# Patient Record
Sex: Female | Born: 1970
Health system: Southern US, Community
[De-identification: ages and names within clinical notes are randomized; demographics above are authoritative.]

## PROBLEM LIST (undated history)

## (undated) DIAGNOSIS — Z803 Family history of malignant neoplasm of breast: Secondary | ICD-10-CM

## (undated) DIAGNOSIS — Z800 Family history of malignant neoplasm of digestive organs: Secondary | ICD-10-CM

## (undated) DIAGNOSIS — Z8049 Family history of malignant neoplasm of other genital organs: Secondary | ICD-10-CM

## (undated) HISTORY — PX: APPENDECTOMY: SHX54

## (undated) HISTORY — DX: Family history of malignant neoplasm of other genital organs: Z80.49

## (undated) HISTORY — PX: UPPER GASTROINTESTINAL ENDOSCOPY: SHX188

## (undated) HISTORY — DX: Family history of malignant neoplasm of breast: Z80.3

## (undated) HISTORY — DX: Family history of malignant neoplasm of digestive organs: Z80.0

---

## 1997-12-17 ENCOUNTER — Ambulatory Visit: Admission: RE | Admit: 1997-12-17 | Discharge: 1997-12-17 | Payer: Self-pay | Admitting: Family Medicine

## 2001-01-25 ENCOUNTER — Other Ambulatory Visit: Admission: RE | Admit: 2001-01-25 | Discharge: 2001-01-25 | Payer: Self-pay | Admitting: Obstetrics and Gynecology

## 2002-12-25 ENCOUNTER — Encounter: Payer: Self-pay | Admitting: Obstetrics and Gynecology

## 2002-12-25 ENCOUNTER — Encounter: Admission: RE | Admit: 2002-12-25 | Discharge: 2002-12-25 | Payer: Self-pay | Admitting: Obstetrics and Gynecology

## 2003-06-06 ENCOUNTER — Other Ambulatory Visit: Admission: RE | Admit: 2003-06-06 | Discharge: 2003-06-06 | Payer: Self-pay | Admitting: Obstetrics and Gynecology

## 2003-06-10 ENCOUNTER — Ambulatory Visit (HOSPITAL_COMMUNITY): Admission: RE | Admit: 2003-06-10 | Discharge: 2003-06-10 | Payer: Self-pay | Admitting: Obstetrics and Gynecology

## 2003-08-01 ENCOUNTER — Other Ambulatory Visit: Admission: RE | Admit: 2003-08-01 | Discharge: 2003-08-01 | Payer: Self-pay | Admitting: Obstetrics and Gynecology

## 2003-11-22 ENCOUNTER — Other Ambulatory Visit: Admission: RE | Admit: 2003-11-22 | Discharge: 2003-11-22 | Payer: Self-pay | Admitting: Obstetrics & Gynecology

## 2004-01-31 ENCOUNTER — Inpatient Hospital Stay (HOSPITAL_COMMUNITY): Admission: RE | Admit: 2004-01-31 | Discharge: 2004-02-03 | Payer: Self-pay | Admitting: Obstetrics & Gynecology

## 2004-11-27 ENCOUNTER — Other Ambulatory Visit: Admission: RE | Admit: 2004-11-27 | Discharge: 2004-11-27 | Payer: Self-pay | Admitting: Obstetrics and Gynecology

## 2005-06-04 ENCOUNTER — Encounter: Admission: RE | Admit: 2005-06-04 | Discharge: 2005-06-04 | Payer: Self-pay | Admitting: Obstetrics and Gynecology

## 2006-06-06 ENCOUNTER — Encounter: Admission: RE | Admit: 2006-06-06 | Discharge: 2006-06-06 | Payer: Self-pay | Admitting: Obstetrics and Gynecology

## 2007-06-26 ENCOUNTER — Encounter: Admission: RE | Admit: 2007-06-26 | Discharge: 2007-06-26 | Payer: Self-pay | Admitting: Obstetrics and Gynecology

## 2008-09-02 ENCOUNTER — Encounter: Admission: RE | Admit: 2008-09-02 | Discharge: 2008-09-02 | Payer: Self-pay | Admitting: Obstetrics and Gynecology

## 2008-12-03 DIAGNOSIS — R5381 Other malaise: Secondary | ICD-10-CM | POA: Insufficient documentation

## 2008-12-03 DIAGNOSIS — R5383 Other fatigue: Secondary | ICD-10-CM

## 2008-12-03 DIAGNOSIS — R42 Dizziness and giddiness: Secondary | ICD-10-CM | POA: Insufficient documentation

## 2008-12-03 DIAGNOSIS — R002 Palpitations: Secondary | ICD-10-CM | POA: Insufficient documentation

## 2010-04-19 HISTORY — PX: LAPAROSCOPIC APPENDECTOMY: SUR753

## 2010-05-10 ENCOUNTER — Encounter: Payer: Self-pay | Admitting: Obstetrics and Gynecology

## 2010-06-15 ENCOUNTER — Other Ambulatory Visit: Payer: Self-pay | Admitting: Family Medicine

## 2010-06-15 ENCOUNTER — Observation Stay (HOSPITAL_COMMUNITY)
Admission: EM | Admit: 2010-06-15 | Discharge: 2010-06-17 | Disposition: A | Discharge status: Home / Self Care | Payer: BC Managed Care – PPO | Source: Ambulatory Visit | Attending: Surgery | Admitting: Surgery

## 2010-06-15 ENCOUNTER — Ambulatory Visit (HOSPITAL_COMMUNITY)
Admission: RE | Admit: 2010-06-15 | Discharge: 2010-06-15 | Disposition: A | Discharge status: Home / Self Care | Payer: BC Managed Care – PPO | Source: Ambulatory Visit | Attending: Family Medicine | Admitting: Family Medicine

## 2010-06-15 DIAGNOSIS — K3533 Acute appendicitis with perforation and localized peritonitis, with abscess: Secondary | ICD-10-CM | POA: Insufficient documentation

## 2010-06-15 DIAGNOSIS — R1011 Right upper quadrant pain: Secondary | ICD-10-CM | POA: Insufficient documentation

## 2010-06-15 DIAGNOSIS — Z01812 Encounter for preprocedural laboratory examination: Secondary | ICD-10-CM | POA: Insufficient documentation

## 2010-06-15 DIAGNOSIS — K3580 Unspecified acute appendicitis: Principal | ICD-10-CM | POA: Insufficient documentation

## 2010-06-15 DIAGNOSIS — K6389 Other specified diseases of intestine: Secondary | ICD-10-CM | POA: Insufficient documentation

## 2010-06-15 MED ORDER — IOHEXOL 300 MG/ML  SOLN
100.0000 mL | Freq: Once | INTRAMUSCULAR | Status: AC | PRN
  Administered 2010-06-15: 100 mL via INTRAVENOUS

## 2010-06-16 ENCOUNTER — Other Ambulatory Visit: Payer: Self-pay | Admitting: General Surgery

## 2010-06-16 LAB — CBC
HCT: 37.7 % (ref 36.0–46.0)
Hemoglobin: 12.6 g/dL (ref 12.0–15.0)
MCHC: 33.4 g/dL (ref 30.0–36.0)
RDW: 13 % (ref 11.5–15.5)
WBC: 7.5 10*3/uL (ref 4.0–10.5)

## 2010-06-16 LAB — DIFFERENTIAL
Basophils Absolute: 0 10*3/uL (ref 0.0–0.1)
Basophils Relative: 0 % (ref 0–1)
Lymphocytes Relative: 23 % (ref 12–46)
Monocytes Absolute: 0.4 10*3/uL (ref 0.1–1.0)
Neutro Abs: 5.2 10*3/uL (ref 1.7–7.7)

## 2010-06-16 LAB — BASIC METABOLIC PANEL
Calcium: 9 mg/dL (ref 8.4–10.5)
Chloride: 107 mEq/L (ref 96–112)
Creatinine, Ser: 0.72 mg/dL (ref 0.4–1.2)
GFR calc Af Amer: >60 mL/min (ref >60)
Sodium: 141 mEq/L (ref 135–145)

## 2010-06-16 LAB — URINE MICROSCOPIC-ADD ON

## 2010-06-16 LAB — URINALYSIS, ROUTINE W REFLEX MICROSCOPIC
Leukocytes, UA: NEGATIVE
Protein, ur: NEGATIVE mg/dL
Specific Gravity, Urine: 1.044 — ABNORMAL HIGH (ref 1.005–1.030)
Urobilinogen, UA: 0.2 mg/dL (ref 0.0–1.0)

## 2010-06-16 LAB — POCT PREGNANCY, URINE: Preg Test, Ur: NEGATIVE

## 2010-06-17 LAB — CBC
MCH: 31.4 pg (ref 26.0–34.0)
MCHC: 32.4 g/dL (ref 30.0–36.0)
Platelets: 113 10*3/uL — ABNORMAL LOW (ref 150–400)
RDW: 13.3 % (ref 11.5–15.5)

## 2010-06-19 NOTE — Op Note (Signed)
NAMEANGELL, PINCOCK               ACCOUNT NO.:  [id number]  MEDICAL RECORD NO.:  [id number]           PATIENT TYPE:  I  LOCATION:  5120                         FACILITY:  [facility]  PHYSICIAN:  Gabrielle Dare. Janee Morn, M.D.DATE OF BIRTH:  [birth year]  DATE OF PROCEDURE:  06/16/2010 DATE OF DISCHARGE:                              OPERATIVE REPORT   PREOPERATIVE DIAGNOSIS:  Acute appendicitis.  POSTOPERATIVE DIAGNOSIS:  Acute appendicitis with microperforation.  PROCEDURE:  Laparoscopic appendectomy.  SURGEON:  Gabrielle Dare. Janee Morn, MD  ANESTHESIA:  General endotracheal.  HISTORY OF PRESENT ILLNESS:  [name] is a 40 year old female, who saw her primary care physician with 4 days of abdominal pain.  She was sent for outpatient CT scan of the abdomen and pelvis, which demonstrated acute appendicitis with phlegmon.  She was sent to the emergency department where I evaluated her.  History and physical examination were consistent.  She is brought emergently to the operating room for laparoscopic appendectomy.  PROCEDURE IN DETAIL:  Informed consent was obtained.  The patient was identified in the preop holding area.  She received intravenous antibiotics.  She was brought to the operating room.  General endotracheal anesthesia was administered by the anesthesia staff.  Her abdomen was prepped and draped in sterile fashion.  We did a time-out procedure.  Infraumbilical region was infiltrated with 0.25% Marcaine, and infraumbilical incision was made.  Subcutaneous tissues were dissected down revealing the anterior fascia.  This was divided sharply along the midline and the peritoneal cavity was entered under direct vision without difficulty.  A 0 Vicryl pursestring suture was placed around the fascial opening, and the Hasson trocar was inserted into the abdomen.  The abdomen was insufflated with carbon dioxide in standard fashion.  Under direct vision, a 12-mm left lower quadrant and a  5-mm right midabdomen port were placed.  Marcaine 0.25% with epinephrine was used at all port sites.  Laparoscopic exploration revealed a very inflamed appendix that was doubled back over on itself and had some adherent ileal fat pad on it.  Fat pad was gently dissected off it. Next, some of the lateral abdominal wall attachments to the appendix were taken down with the harmonic scalpel.  Next, the mesoappendix was gradually taken down with the harmonic scalpel achieving excellent hemostasis.  The appendix was unfolded somewhat and we got back to the base, which was not significantly acutely inflamed, however, it was somewhat wide, so eased reticulating Endo-GIA stapler.  This was used and the base was divided, and the stapler we had available was not cutting so then all of the staple lines were left in place.  The appendix was cut and placed in EndoCatch bag and removed from the abdomen via the left lower quadrant port site.  Please note the appendix had a very tiny area of perforation consistent with microperforation. There was no significant purulence in the abdomen, just a couple of drops adherent to the appendix.  The abdomen was copiously irrigated. Staple line remained intact and away from the terminal ileum.  The area was further irrigated and meticulous hemostasis was assured.  Irrigation fluid returned clear.  We used a total of almost 2 liters.  Staple line was checked one more time and there was no bleeding as well.  The ports were then removed under direct vision.  Pneumoperitoneum was released. Infraumbilical fascia was closed by tying the 0 Vicryl pursestring suture with care not to trap any intra-abdominal contents.  All three wounds were copiously irrigated with saline.  The skin of each was closed with running 4-0 Vicryl subcuticular stitch followed by Dermabond.  Sponge, needle, and instrument counts were all correct.  The patient tolerated the procedure well without  apparent complication and was taken to the recovery room in stable condition.     Gabrielle Dare Janee Morn, M.D.     BET/MEDQ  D:  06/16/2010  T:  06/16/2010  Job:  [id number]  Electronically Signed by Violeta Gelinas M.D. on 06/19/2010 10:14:46 AM

## 2010-06-24 NOTE — H&P (Addendum)
  [name], [name] NO.:  [id number]  MEDICAL RECORD NO.:  [id number]           PATIENT TYPE:  LOCATION:                                 FACILITY:  PHYSICIAN:  Gabrielle Dare. Janee Morn, M.D.DATE OF BIRTH:  [birth year]  DATE OF ADMISSION:  06/16/2010 DATE OF DISCHARGE:                             HISTORY & PHYSICAL   CHIEF COMPLAINT:  Right lower quadrant abdominal pain.  HISTORY OF PRESENT ILLNESS:  [name]  is a 40 year old white female who developed right lower quadrant abdominal pain since last Friday. This persisted for 4 days.  She was sent for an outpatient CT by her primary care physician, apparently which showed appendicitis with phlegmon and no evidence of perforation.  She continues to have pain in her right lower quadrant.  She has had no nausea or vomiting.  PAST MEDICAL HISTORY:  Negative.  PAST SURGICAL HISTORY:  Cesarean section x2.  SOCIAL HISTORY:  Does not smoke, does not drink alcohol.  She works in an office.  REVIEW OF SYSTEMS:  Include GI complaints as above.  In addition, she has had no significant nausea and vomiting.  Remainder of her system reviews are unremarkable.  MEDICATIONS:  Include birth control pill and Restasis.  ALLERGIES:  NO KNOWN DRUG ALLERGIES.  PHYSICAL EXAM:  VITAL SIGNS:  Temperature 98.2, heart rate 78, blood pressure 127/70, respirations 20, saturation is 98% on room air. HEENT: Ears are clear.  Nares are patent.  Mouth has moist oral mucosa. NECK:  Supple with no tenderness or masses. PULMONARY:  Respiratory effort is good.  Lungs are clear to auscultation. CARDIOVASCULAR:  Heart has normal S1-S2.  Pulses palpable in left chest. Distal pulses are 2+ with no peripheral edema. ABDOMEN:  Soft.  There is tenderness in the right lower quadrant with no guarding.  There is no generalized peritoneal signs.  The abdomen is nondistended.  Bowel sounds are hypoactive. LYMPH:  There was no supraclavicular,  cervical, or periumbilical lymphadenopathy. SKIN:  Warm and dry.  LABORATORY STUDIES:  White blood cell count 7.5, hemoglobin 12.6.  Basic metabolic panel is within normal limits.  CT scan of the abdomen and pelvis shows appendicitis with phlegmon as described above.  There is no evidence of perforation.  IMPRESSION:  Acute appendicitis.  PLAN:  IV antibiotics and we will take her to the operating room tonight for emergency appendectomy.  We will attempt laparoscopic approach. Procedure risks and benefits were discussed in detail with the patient. She is agreeable.  Questions were answered.     Gabrielle Dare Janee Morn, M.D.     BET/MEDQ  D:  06/16/2010  T:  06/16/2010  Job:  [id number]  Electronically Signed by Violeta Gelinas M.D. on 06/23/2010 04:37:33 PM Electronically Signed by Violeta Gelinas M.D. on 06/23/2010 05:17:50 PM Electronically Signed by Violeta Gelinas M.D. on 06/23/2010 05:17:50 PM Electronically Signed by Violeta Gelinas M.D. on 06/23/2010 07:43:20 PM

## 2010-07-01 NOTE — Discharge Summary (Signed)
  [name], [name]               ACCOUNT NO.:  [id number]  MEDICAL RECORD NO.:  [id number]           PATIENT TYPE:  I  LOCATION:  5120                         FACILITY:  [facility]  PHYSICIAN:  Angelia Mould. Derrell Lolling, M.D.DATE OF BIRTH:  1970-11-14  DATE OF ADMISSION:  06/15/2010 DATE OF DISCHARGE:  06/17/2010                              DISCHARGE SUMMARY   HISTORY OF PRESENT ILLNESS:  [name] is a 40 year old female, who developed right lower quadrant pain several days prior to admission. She was sent for an outpatient CT by her prior primary care physician which showed appendicitis, phlegmonous formation, but no evidence of perforation.  She continued to have pain and was sent to the emergency department for evaluation.  She was seen in the emergency department by Dr. Violeta Gelinas, who reviewed her CT scan and exam, the patient confirmed that both clinically and diagnostically the patient had evidence of appendicitis.  Decision was made to start the patient on IV antibiotics and admit the patient for inpatient management and surgical intervention.  SUMMARY OF HOSPITAL COURSE:  The patient was admitted on June 15, 2010.  She was started on IV Invanz 1 g IV q.24 h.  She was taken to the operating room on June 16, 2010.  She underwent laparoscopic appendectomy as well as removal of the phlegmonous formation.  She was sent to the floor in a stable condition.  She received an additional 2 doses of IV Invanz.  Her diet was started on clears and subsequently advanced as of today post technically postoperative day 1, although her surgery was early yesterday and she is feeling quite well having no nausea or vomiting.  She is passing flatus and has minimal pain.  At this point, I feel the patient is stable for discharge.  She had followup labs that showed her white blood cell count remained at 6.6. She has been afebrile in the postoperative course.  DISCHARGE DIAGNOSIS:  Acute  appendicitis, status post laparoscopic appendectomy.  PLAN:  The patient will be given prescription for Augmentin 875 mg twice daily for 7 days.  She is given a prescription for Percocet 1-2 tablets q.6 h. p.r.n. pain.  She is given instructions regarding activity and dietary limitations.  She is asked to come back to the clinic in approximately 2-3 weeks' time for further followup.  With regards to work, she is released to work at her discretion as she does not have any strenuous activity throughout her work day.     Brayton El, PA-C   ______________________________ Angelia Mould. Derrell Lolling, M.D.    KB/MEDQ  D:  06/17/2010  T:  06/18/2010  Job:  [id number]  Electronically Signed by Brayton El  on 06/30/2010 11:00:24 AM Electronically Signed by Claud Kelp M.D. on 07/01/2010 04:54:09 AM

## 2010-09-04 NOTE — Discharge Summary (Signed)
[name], [name] NO.:  [id number]   MEDICAL RECORD NO.:  [id number]          PATIENT TYPE:  INP   LOCATION:  9127                          FACILITY:  [facility]   PHYSICIAN:  Carrington Clamp, M.D. DATE OF BIRTH:  1970-12-09   DATE OF ADMISSION:  01/31/2004  DATE OF DISCHARGE:  02/03/2004                                 DISCHARGE SUMMARY   FINAL DIAGNOSES:  1.  Intrauterine pregnancy at [redacted] weeks gestation.  2.  History of previous cesarean section, desires repeat cesarean section.   PROCEDURE:  Repeat low transverse cesarean section.  Surgeon:  Dr. Annamaria Helling.  Assistant:  Dr. Carrington Clamp.  Complications:  None.   This 40 year old G2 P1 presents at [redacted] weeks gestation for a repeat cesarean  section.  The patient had had a previous cesarean section 13 years ago  secondary to a breech presentation and expressed her desires for repeat  cesarean section during this present pregnancy.  The patient's antepartum  course up to this point had been mostly uncomplicated.  She did have a Pap  performed at the beginning of her pregnancy which showed some atypical  glandular cells.  Colposcopy was performed and her repeat Pap smear in  August was normal.  Otherwise, the patient's antepartum course was  uncomplicated.  She had a negative group B strep culture obtained at 35  weeks.  She was taken to the operating room on January 31, 2004 by Dr.  Annamaria Helling where repeat low transverse cesarean section was performed with  the delivery of a 7-pound 8-ounce female infant with Apgars of 8 and 9.  Delivery went without complications.  The patient's postoperative course was  benign without significant fevers.  She was felt ready for discharge on  postoperative day #3.  She was sent home on a regular diet, told to decrease  activities, told to continue her prenatal vitamins and to take an iron  supplement daily, was given Percocet one to two q.4h. as needed for pain,  and was to  follow up in the office in 4 weeks.   LABORATORY DATA ON DISCHARGE:  The patient had a hemoglobin of 9.5; white  blood cell count of 8.1; and platelets of 104,000.      MB/MEDQ  D:  03/04/2004  T:  03/05/2004  Job:  [id number]

## 2010-09-04 NOTE — Op Note (Signed)
[name], [name] NO.:  [id number]   MEDICAL RECORD NO.:  [id number]          PATIENT TYPE:  INP   LOCATION:  9198                          FACILITY:  [facility]   PHYSICIAN:  Gerrit Friends. Aldona Bar, M.D.   DATE OF BIRTH:  [birth year]   DATE OF PROCEDURE:  01/31/2004  DATE OF DISCHARGE:                                 OPERATIVE REPORT   PREOPERATIVE DIAGNOSES:  1.  Previous cesarean section.  2.  Desire for repeat cesarean section.  3.  39 week intrauterine pregnancy.   POSTOPERATIVE DIAGNOSES:  1.  Previous cesarean section.  2.  Desire for repeat cesarean section.  3.  39 week intrauterine pregnancy.  4.  Delivery of 7 pound 8 ounce female infant Apgar's of 8 & 9.   PROCEDURE:  Repeat low transverse cesarean section.   SURGEON:  Gerrit Friends. Aldona Bar, M.D.   ASSISTANT:  Carrington Clamp, M.D.   ANESTHESIA:  Spinal, Dorinda Hill T. Pamalee Leyden, M.D.   HISTORY:  This 40 year old, gravida 2, para 1 is now at [redacted] weeks gestation.  She had a previous cesarean section 13 years ago for a breech presentation  and is desirous of a repeat cesarean section for delivery with this  pregnancy.   DESCRIPTION OF PROCEDURE:  She was taken to the operating room where after  the satisfactory induction of spinal anesthetic by Dr. Pamalee Leyden, she was  prepped and draped in the usual fashion having been placed in the supine  position slightly tilted to the left. After the patient was adequately  draped with a Foley catheter in place and after good anesthetic levels were  documented, procedure was begun.   A Pfannenstiel incision was made and with minimal difficulty dissected down  sharply to and through the fascia in a low transverse fashion with  hemostasis created at each layer. A subfascial space was created inferiorly  and superiorly after the fascia was opened. Muscles separated in the  midline, peritoneum identified and entered appropriately with care taken to  avoid the bowels superiorly and the  bladder inferiorly. At this time, the  vesicouterine peritoneum was identified and incised in a low transverse  fashion, pushed off the lower uterine segment with ease and then a sharp  incision to the uterus in a low transverse fashion was made with the  Metzenbaum scissors and extended laterally with the fingers.  Amniotomy was  produced--clear fluid--and thereafter from a vertex position a viable female  infant which cried spontaneously at once was delivered. There was a nuchal  cord x1.   After the cord was clamped and cut, the infant was passed off to the  awaiting team and subsequently taken to the nursery in good condition.  Subsequent weight found to be 7 pounds 8 ounces and Apgar's were assigned at  8 & 9.   After the cord bloods were collected, the placenta was delivered intact. The  uterus was then exteriorized, rendered free of any remaining products of  conception and good uterine contractility was afforded with slowly given  intravenous Pitocin and manual stimulation. At this time, the uterine  incision was closed using a single layer of #1 Vicryl in a running locking  fashion and this was over sewn with several figure-of-eight #1 Vicryl's.  Good hemostasis noted. The bladder flap was then reapproximated using  interrupted #0 Vicryl. Tubes and ovaries were noted to be normal and at this  time after the abdomen was lavaged of all free blood and clot, the uterus  was replaced into the abdominal cavity.  At this time, all counts were noted  to be correct and no foreign bodies were noted to be remaining in the  abdominal cavity. Closure of the abdomen at this time was begun in layers.  The abdominoperitoneum was closed with #0 Vicryl in a running fashion and  muscles secured with same. _________ fascial hemostasis, the fascia was then  reapproximated using #0 Vicryl with an angle in the midline bilaterally. The  subcutaneous tissue was rendered hemostatic and staples were then  used to  close the skin. A sterile pressure dressing was applied and the patient at  this time was transferred to the recovery room in satisfactory condition  having tolerated the procedure well.  Estimated blood loss 500 mL.  All  counts x2 correct.   In conclusion, at the end of the procedure, both mother and baby were doing  well in their respective recovery areas.   In summary, this patient desired a repeat cesarean section at term and it  was carried out according to her wishes with delivery of a 7 pound 8 ounce  female infant with Apgar's of 8 & 9.  The procedure went well. All counts  correct x2.      RMW/MEDQ  D:  01/31/2004  T:  01/31/2004  Job:  [id number]

## 2010-12-14 ENCOUNTER — Other Ambulatory Visit: Payer: Self-pay | Admitting: Obstetrics and Gynecology

## 2011-04-07 ENCOUNTER — Ambulatory Visit: Payer: BC Managed Care – PPO | Admitting: Internal Medicine

## 2011-05-10 ENCOUNTER — Ambulatory Visit: Payer: BC Managed Care – PPO | Admitting: Family Medicine

## 2011-05-10 DIAGNOSIS — Z0289 Encounter for other administrative examinations: Secondary | ICD-10-CM

## 2011-06-01 ENCOUNTER — Encounter: Payer: Self-pay | Admitting: Cardiovascular Disease

## 2011-06-01 ENCOUNTER — Telehealth: Payer: Self-pay | Admitting: *Deleted

## 2011-06-01 ENCOUNTER — Ambulatory Visit (INDEPENDENT_AMBULATORY_CARE_PROVIDER_SITE_OTHER): Payer: BC Managed Care – PPO | Admitting: Cardiovascular Disease

## 2011-06-01 VITALS — BP 121/70 | HR 77 | Ht 68.0 in | Wt 171.0 lb

## 2011-06-01 DIAGNOSIS — R002 Palpitations: Secondary | ICD-10-CM

## 2011-06-01 NOTE — Telephone Encounter (Signed)
Pt saw Dr. Clifton James today and needs 3 week follow up.  Appt was made for 3 month follow up in May.  I called pt to schedule 3 week follow up.  Left message to call back

## 2011-06-01 NOTE — Assessment & Plan Note (Addendum)
Her episodes of palpitations are most likely premature beats or SVT. Will arrange a 48 hour Holter monitor. Will also arrange an echo to exclude structural heart disease and assess LVEF. We will check a BMET and TSH when she comes in for her echo. She does not want to do it today since she has not eaten lunch yet.

## 2011-06-01 NOTE — Patient Instructions (Addendum)
Your physician recommends that you schedule a follow-up appointment in:3 weeks.   Your physician has recommended that you wear a holter monitor. Holter monitors are medical devices that record the heart's electrical activity. Doctors most often use these monitors to diagnose arrhythmias. Arrhythmias are problems with the speed or rhythm of the heartbeat. The monitor is a small, portable device. You can wear one while you do your normal daily activities. This is usually used to diagnose what is causing palpitations/syncope (passing out).   Your physician has requested that you have an echocardiogram. Echocardiography is a painless test that uses sound waves to create images of your heart. It provides your doctor with information about the size and shape of your heart and how well your heart's chambers and valves are working. This procedure takes approximately one hour. There are no restrictions for this procedure.   Your physician recommends that you return for lab work on day of echo

## 2011-06-01 NOTE — Progress Notes (Signed)
   History of Present Illness:41 yo WF with no significant past medical history who is a self referral today for evaluation of palpitations. She has no prior cardiac history. She has no DM, HTN, HLD. She describes feeling her heart skip a beat and goes fast. These last for a few seconds. No dizziness, near syncope or syncope. No chest pain or SOB. She drinks two caffeinated drinks per day but does not smoke.   Past Medical History  Diagnosis Date  . Acute appendicitis 2012    Past Surgical History  Procedure Date  . Laparoscopic appendectomy 2012  . Cesarean section 1992, 2005    Current Outpatient Prescriptions  Medication Sig Dispense Refill  . JUNEL FE 1.5/30 1.5-30 MG-MCG tablet As directed -birth control      . Multiple Vitamin (MULTIVITAMIN) tablet Take 1 tablet by mouth daily.      . vitamin B-12 (CYANOCOBALAMIN) 1000 MCG tablet Take 1,000 mcg by mouth daily.        No Known Allergies  History   Social History  . Marital Status: Married    Spouse Name: N/A    Number of Children: 2  . Years of Education: N/A   Occupational History  . Physiological scientist    Social History Main Topics  . Smoking status: Never Smoker   . Smokeless tobacco: Not on file  . Alcohol Use: No  . Drug Use: No  . Sexually Active: Not on file   Other Topics Concern  . Not on file   Social History Narrative  . No narrative on file    Family History  Problem Relation Age of Onset  . Cancer Sister     Breast    Review of Systems:  As stated in the HPI and otherwise negative.   BP 121/70  Pulse 77  Ht 5\' 8"  (1.727 m)  Wt 171 lb (77.565 kg)  BMI 26.00 kg/m2  Physical Examination: General: Well developed, well nourished, NAD HEENT: OP clear, mucus membranes moist SKIN: warm, dry. No rashes. Neuro: No focal deficits Musculoskeletal: Muscle strength 5/5 all ext Psychiatric: Mood and affect normal Neck: No JVD, no carotid bruits, no thyromegaly, no lymphadenopathy. Lungs:Clear  bilaterally, no wheezes, rhonci, crackles Cardiovascular: Regular rate and rhythm. No murmurs, gallops or rubs. Abdomen:Soft. Bowel sounds present. Non-tender.  Extremities: No lower extremity edema. Pulses are 2 + in the bilateral DP/PT.  EKG: NSR, rate 89 bpm. Poor R wave progression through precordial leads.

## 2011-06-02 NOTE — Telephone Encounter (Signed)
Spoke with pt and appt made for her to see Dr. Clifton James on July 12, 2011 at 10:30.

## 2011-06-02 NOTE — Telephone Encounter (Signed)
Follow up from previous call:  Returning call from nurse

## 2011-06-09 ENCOUNTER — Ambulatory Visit: Payer: BC Managed Care – PPO | Admitting: Cardiovascular Disease

## 2011-06-18 ENCOUNTER — Encounter (INDEPENDENT_AMBULATORY_CARE_PROVIDER_SITE_OTHER): Payer: BC Managed Care – PPO

## 2011-06-18 ENCOUNTER — Ambulatory Visit (HOSPITAL_COMMUNITY): Payer: BC Managed Care – PPO | Attending: Cardiology

## 2011-06-18 ENCOUNTER — Other Ambulatory Visit: Payer: Self-pay

## 2011-06-18 ENCOUNTER — Other Ambulatory Visit (INDEPENDENT_AMBULATORY_CARE_PROVIDER_SITE_OTHER): Payer: BC Managed Care – PPO

## 2011-06-18 DIAGNOSIS — R002 Palpitations: Secondary | ICD-10-CM

## 2011-06-18 LAB — BASIC METABOLIC PANEL
Calcium: 8.9 mg/dL (ref 8.4–10.5)
GFR: 82.4 mL/min (ref >60.00)
Potassium: 3.3 mEq/L — ABNORMAL LOW (ref 3.5–5.1)
Sodium: 138 mEq/L (ref 135–145)

## 2011-06-18 LAB — TSH: TSH: 2.67 u[IU]/mL (ref 0.35–5.50)

## 2011-07-01 ENCOUNTER — Telehealth: Payer: Self-pay | Admitting: Cardiovascular Disease

## 2011-07-01 NOTE — Telephone Encounter (Signed)
Fu call °Patient returning your call °

## 2011-07-01 NOTE — Telephone Encounter (Signed)
Pt notified of monitor results 

## 2011-07-12 ENCOUNTER — Ambulatory Visit (INDEPENDENT_AMBULATORY_CARE_PROVIDER_SITE_OTHER): Payer: BC Managed Care – PPO | Admitting: Cardiovascular Disease

## 2011-07-12 ENCOUNTER — Encounter: Payer: Self-pay | Admitting: Cardiovascular Disease

## 2011-07-12 VITALS — BP 110/68 | HR 76 | Ht 67.5 in | Wt 171.4 lb

## 2011-07-12 DIAGNOSIS — I491 Atrial premature depolarization: Secondary | ICD-10-CM

## 2011-07-12 NOTE — Progress Notes (Signed)
   History of Present Illness: 41 yo WF with no significant past medical history who is here today for cardiac follow up. I saw her one month ago for evaluation of palpitations. She has no prior cardiac history. She has no DM, HTN, HLD. She described feeling her heart skip a beat and then racing.  These last for a few seconds. No dizziness, near syncope or syncope. No chest pain or SOB. She drinks two caffeinated drinks per day but does not smoke. I ordered an echo and a 48 hour Holter monitor. Her echo showed normal LV size and function with mild MR. Holter with NSR, PACs.   She tells me today that she has been feeling well. She has cut out her morning coffee and her palpitations are less frequent. She does notice these more frequently during stressful times.   Primary Care Physician: None   Past Medical History  Diagnosis Date  . Acute appendicitis 2012    Past Surgical History  Procedure Date  . Laparoscopic appendectomy 2012  . Cesarean section 1992, 2005    Current Outpatient Prescriptions  Medication Sig Dispense Refill  . JUNEL FE 1.5/30 1.5-30 MG-MCG tablet As directed -birth control      . Multiple Vitamin (MULTIVITAMIN) tablet Take 1 tablet by mouth daily.      . vitamin B-12 (CYANOCOBALAMIN) 1000 MCG tablet Take 1,000 mcg by mouth daily.        No Known Allergies  History   Social History  . Marital Status: Married    Spouse Name: N/A    Number of Children: 2  . Years of Education: N/A   Occupational History  . Physiological scientist    Social History Main Topics  . Smoking status: Never Smoker   . Smokeless tobacco: Not on file  . Alcohol Use: No  . Drug Use: No  . Sexually Active: Not on file   Other Topics Concern  . Not on file   Social History Narrative  . No narrative on file    Family History  Problem Relation Age of Onset  . Cancer Sister     Breast    Review of Systems:  As stated in the HPI and otherwise negative.   BP 110/68  Pulse 76   Ht 5' 7.5" (1.715 m)  Wt 171 lb 6.4 oz (77.747 kg)  BMI 26.45 kg/m2  Physical Examination: General: Well developed, well nourished, NAD HEENT: OP clear, mucus membranes moist SKIN: warm, dry. No rashes. Neuro: No focal deficits Musculoskeletal: Muscle strength 5/5 all ext Psychiatric: Mood and affect normal Neck: No JVD, no carotid bruits, no thyromegaly, no lymphadenopathy. Lungs:Clear bilaterally, no wheezes, rhonci, crackles Cardiovascular: Regular rate and rhythm. No murmurs, gallops or rubs. Abdomen:Soft. Bowel sounds present. Non-tender.  Extremities: No lower extremity edema. Pulses are 2 + in the bilateral DP/PT.  Echo: 06/18/11: Left ventricle: The cavity size was normal. Wall thickness was normal. Systolic function was normal. The estimated ejection fraction was in the range of 55% to 60%. Wall motion was normal; there were no regional wall motion abnormalities. Left ventricular diastolic function parameters were normal. - Mitral valve: Mild regurgitation.  48 Hour Holter monitor: March 2013: NSR, PACs. No significant bradycardia, pauses, VT, SVT or atrial fib.

## 2011-07-12 NOTE — Assessment & Plan Note (Signed)
Likely the cause of her palpitations. Avoid stimulants. No changes. Follow up one year.

## 2011-07-12 NOTE — Patient Instructions (Signed)
Your physician wants you to follow-up in: 12 months.  You will receive a reminder letter in the mail two months in advance. If you don't receive a letter, please call our office to schedule the follow-up appointment.  Your physician recommends that you continue on your current medications as directed. Please refer to the Current Medication list given to you today.   

## 2011-08-31 ENCOUNTER — Ambulatory Visit: Payer: BC Managed Care – PPO | Admitting: Cardiovascular Disease

## 2012-02-28 ENCOUNTER — Other Ambulatory Visit: Payer: Self-pay | Admitting: Obstetrics and Gynecology

## 2012-02-28 DIAGNOSIS — R928 Other abnormal and inconclusive findings on diagnostic imaging of breast: Secondary | ICD-10-CM

## 2012-03-02 ENCOUNTER — Ambulatory Visit
Admission: RE | Admit: 2012-03-02 | Discharge: 2012-03-02 | Disposition: A | Discharge status: Home / Self Care | Payer: BC Managed Care – PPO | Source: Ambulatory Visit | Attending: Obstetrics and Gynecology | Admitting: Obstetrics and Gynecology

## 2012-03-02 DIAGNOSIS — R928 Other abnormal and inconclusive findings on diagnostic imaging of breast: Secondary | ICD-10-CM

## 2012-03-03 ENCOUNTER — Other Ambulatory Visit: Payer: BC Managed Care – PPO

## 2014-03-06 ENCOUNTER — Other Ambulatory Visit: Payer: Self-pay | Admitting: Obstetrics and Gynecology

## 2014-03-06 DIAGNOSIS — R928 Other abnormal and inconclusive findings on diagnostic imaging of breast: Secondary | ICD-10-CM

## 2014-03-25 ENCOUNTER — Ambulatory Visit
Admission: RE | Admit: 2014-03-25 | Discharge: 2014-03-25 | Disposition: A | Discharge status: Home / Self Care | Payer: BC Managed Care – PPO | Source: Ambulatory Visit | Attending: Obstetrics and Gynecology | Admitting: Obstetrics and Gynecology

## 2014-03-25 DIAGNOSIS — R928 Other abnormal and inconclusive findings on diagnostic imaging of breast: Secondary | ICD-10-CM

## 2015-02-27 ENCOUNTER — Other Ambulatory Visit: Payer: Self-pay

## 2015-02-27 DIAGNOSIS — Z1231 Encounter for screening mammogram for malignant neoplasm of breast: Secondary | ICD-10-CM

## 2015-04-03 ENCOUNTER — Ambulatory Visit
Admission: RE | Admit: 2015-04-03 | Discharge: 2015-04-03 | Disposition: A | Discharge status: Home / Self Care | Payer: BLUE CROSS/BLUE SHIELD | Source: Ambulatory Visit

## 2015-04-03 DIAGNOSIS — Z1231 Encounter for screening mammogram for malignant neoplasm of breast: Secondary | ICD-10-CM

## 2016-12-08 ENCOUNTER — Other Ambulatory Visit: Payer: Self-pay | Admitting: Obstetrics and Gynecology

## 2016-12-08 DIAGNOSIS — Z1231 Encounter for screening mammogram for malignant neoplasm of breast: Secondary | ICD-10-CM

## 2016-12-15 ENCOUNTER — Ambulatory Visit (INDEPENDENT_AMBULATORY_CARE_PROVIDER_SITE_OTHER): Payer: BLUE CROSS/BLUE SHIELD | Admitting: Adult Health

## 2016-12-15 ENCOUNTER — Encounter: Payer: Self-pay | Admitting: Family Medicine

## 2016-12-15 ENCOUNTER — Encounter: Payer: Self-pay | Admitting: Adult Health

## 2016-12-15 DIAGNOSIS — Z0000 Encounter for general adult medical examination without abnormal findings: Secondary | ICD-10-CM | POA: Diagnosis not present

## 2016-12-15 DIAGNOSIS — I491 Atrial premature depolarization: Secondary | ICD-10-CM | POA: Diagnosis not present

## 2016-12-15 NOTE — Assessment & Plan Note (Signed)
Denies current cardiac sx's 

## 2016-12-15 NOTE — Assessment & Plan Note (Signed)
Continue excellent water intake and heart healthy diet. Please schedule nurse visit for fasting labs and annual CPE. Continue annual f/u with OG/GYN.

## 2016-12-15 NOTE — Assessment & Plan Note (Signed)
Will check TSH and Vit d levels.

## 2016-12-15 NOTE — Patient Instructions (Signed)
Heart-Healthy Eating Plan Many factors influence your heart health, including eating and exercise habits. Heart (coronary) risk increases with abnormal blood fat (lipid) levels. Heart-healthy meal planning includes limiting unhealthy fats, increasing healthy fats, and making other small dietary changes. This includes maintaining a healthy body weight to help keep lipid levels within a normal range. What is my plan? Your health care provider recommends that you:  Get no more than _25_% of the total calories in your daily diet from fat.  Limit your intake of saturated fat to less than __5__% of your total calories each day.  Limit the amount of cholesterol in your diet to less than _300_ mg per day.  What types of fat should I choose?  Choose healthy fats more often. Choose monounsaturated and polyunsaturated fats, such as olive oil and canola oil, flaxseeds, walnuts, almonds, and seeds.  Eat more omega-3 fats. Good choices include salmon, mackerel, sardines, tuna, flaxseed oil, and ground flaxseeds. Aim to eat fish at least two times each week.  Limit saturated fats. Saturated fats are primarily found in animal products, such as meats, butter, and cream. Plant sources of saturated fats include palm oil, palm kernel oil, and coconut oil.  Avoid foods with partially hydrogenated oils in them. These contain trans fats. Examples of foods that contain trans fats are stick margarine, some tub margarines, cookies, crackers, and other baked goods. What general guidelines do I need to follow?  Check food labels carefully to identify foods with trans fats or high amounts of saturated fat.  Fill one half of your plate with vegetables and green salads. Eat 4-5 servings of vegetables per day. A serving of vegetables equals 1 cup of raw leafy vegetables,  cup of raw or cooked cut-up vegetables, or  cup of vegetable juice.  Fill one fourth of your plate with whole grains. Look for the word "whole" as the  first word in the ingredient list.  Fill one fourth of your plate with lean protein foods.  Eat 4-5 servings of fruit per day. A serving of fruit equals one medium whole fruit,  cup of dried fruit,  cup of fresh, frozen, or canned fruit, or  cup of 100% fruit juice.  Eat more foods that contain soluble fiber. Examples of foods that contain this type of fiber are apples, broccoli, carrots, beans, peas, and barley. Aim to get 20-30 g of fiber per day.  Eat more home-cooked food and less restaurant, buffet, and fast food.  Limit or avoid alcohol.  Limit foods that are high in starch and sugar.  Avoid fried foods.  Cook foods by using methods other than frying. Baking, boiling, grilling, and broiling are all great options. Other fat-reducing suggestions include: ? Removing the skin from poultry. ? Removing all visible fats from meats. ? Skimming the fat off of stews, soups, and gravies before serving them. ? Steaming vegetables in water or broth.  Lose weight if you are overweight. Losing just 5-10% of your initial body weight can help your overall health and prevent diseases such as diabetes and heart disease.  Increase your consumption of nuts, legumes, and seeds to 4-5 servings per week. One serving of dried beans or legumes equals  cup after being cooked, one serving of nuts equals 1 ounces, and one serving of seeds equals  ounce or 1 tablespoon.  You may need to monitor your salt (sodium) intake, especially if you have high blood pressure. Talk with your health care provider or dietitian to get  more information about reducing sodium. What foods can I eat? Grains  Breads, including [place], white, pita, wheat, raisin, rye, oatmeal, and [place]. Tortillas that are neither fried nor made with lard or trans fat. Low-fat rolls, including hotdog and hamburger buns and English muffins. Biscuits. Muffins. Waffles. Pancakes. Light popcorn. Whole-grain cereals. Flatbread. Melba toast.  Pretzels. Breadsticks. Rusks. Low-fat snacks and crackers, including oyster, saltine, matzo, graham, animal, and rye. Rice and pasta, including brown rice and those that are made with whole wheat. Vegetables All vegetables. Fruits All fruits, but limit coconut. Meats and Other Protein Sources Lean, well-trimmed beef, veal, pork, and lamb. Chicken and [place] without skin. All fish and shellfish. Wild duck, rabbit, pheasant, and venison. Egg whites or low-cholesterol egg substitutes. Dried beans, peas, lentils, and tofu.Seeds and most nuts. Dairy Low-fat or nonfat cheeses, including ricotta, string, and mozzarella. Skim or 1% milk that is liquid, powdered, or evaporated. Buttermilk that is made with low-fat milk. Nonfat or low-fat yogurt. Beverages Mineral water. Diet carbonated beverages. Sweets and Desserts Sherbets and fruit ices. Honey, jam, marmalade, jelly, and syrups. Meringues and gelatins. Pure sugar candy, such as hard candy, jelly beans, gumdrops, mints, marshmallows, and small amounts of dark chocolate. [employer] Eat all sweets and desserts in moderation. Fats and Oils Nonhydrogenated (trans-free) margarines. Vegetable oils, including soybean, sesame, sunflower, olive, peanut, safflower, corn, canola, and cottonseed. Salad dressings or mayonnaise that are made with a vegetable oil. Limit added fats and oils that you use for cooking, baking, salads, and as spreads. Other Cocoa powder. Coffee and tea. All seasonings and condiments. The items listed above may not be a complete list of recommended foods or beverages. Contact your dietitian for more options. What foods are not recommended? Grains Breads that are made with saturated or trans fats, oils, or whole milk. Croissants. Butter rolls. Cheese breads. Sweet rolls. Donuts. Buttered popcorn. Chow mein noodles. High-fat crackers, such as cheese or butter crackers. Meats and Other Protein Sources Fatty meats, such as hotdogs,  short ribs, sausage, spareribs, bacon, ribeye roast or steak, and mutton. High-fat deli meats, such as salami and bologna. Caviar. Domestic duck and goose. Organ meats, such as kidney, liver, sweetbreads, brains, gizzard, chitterlings, and heart. Dairy Cream, sour cream, cream cheese, and creamed cottage cheese. Whole milk cheeses, including blue (bleu), Monterey Jack, [place], [place], American, [place], Swiss, [place], [place], and [place]. Whole or 2% milk that is liquid, evaporated, or condensed. Whole buttermilk. Cream sauce or high-fat cheese sauce. Yogurt that is made from whole milk. Beverages Regular sodas and drinks with added sugar. Sweets and Desserts Frosting. Pudding. Cookies. Cakes other than angel food cake. Candy that has milk chocolate or white chocolate, hydrogenated fat, butter, coconut, or unknown ingredients. Buttered syrups. Full-fat ice cream or ice cream drinks. Fats and Oils Gravy that has suet, meat fat, or shortening. Cocoa butter, hydrogenated oils, palm oil, coconut oil, palm kernel oil. These can often be found in baked products, candy, fried foods, nondairy creamers, and whipped toppings. Solid fats and shortenings, including bacon fat, salt pork, lard, and butter. Nondairy cream substitutes, such as coffee creamers and sour cream substitutes. Salad dressings that are made of unknown oils, cheese, or sour cream. The items listed above may not be a complete list of foods and beverages to avoid. Contact your dietitian for more information. This information is not intended to replace advice given to you by your health care provider. Make sure you discuss any questions you have with your health care  provider. Document Released: 01/13/2008 Document Revised: 10/24/2015 Document Reviewed: 09/27/2013 Elsevier Interactive Patient Education  2017 [employer] Inc.   Back Exercises The following exercises strengthen the muscles that help to support the back. They also help to keep the  lower back flexible. Doing these exercises can help to prevent back pain or lessen existing pain. If you have back pain or discomfort, try doing these exercises 2-3 times each day or as told by your health care provider. When the pain goes away, do them once each day, but increase the number of times that you repeat the steps for each exercise (do more repetitions). If you do not have back pain or discomfort, do these exercises once each day or as told by your health care provider. Exercises Single Knee to Chest  Repeat these steps 3-5 times for each leg: 1. Lie on your back on a firm bed or the floor with your legs extended. 2. Bring one knee to your chest. Your other leg should stay extended and in contact with the floor. 3. Hold your knee in place by grabbing your knee or thigh. 4. Pull on your knee until you feel a gentle stretch in your lower back. 5. Hold the stretch for 10-30 seconds. 6. Slowly release and straighten your leg.  Pelvic Tilt  Repeat these steps 5-10 times: 1. Lie on your back on a firm bed or the floor with your legs extended. 2. Bend your knees so they are pointing toward the ceiling and your feet are flat on the floor. 3. Tighten your lower abdominal muscles to press your lower back against the floor. This motion will tilt your pelvis so your tailbone points up toward the ceiling instead of pointing to your feet or the floor. 4. With gentle tension and even breathing, hold this position for 5-10 seconds.  Cat-Cow  Repeat these steps until your lower back becomes more flexible: 1. Get into a hands-and-knees position on a firm surface. Keep your hands under your shoulders, and keep your knees under your hips. You may place padding under your knees for comfort. 2. Let your head hang down, and point your tailbone toward the floor so your lower back becomes rounded like the back of a cat. 3. Hold this position for 5 seconds. 4. Slowly lift your head and point your  tailbone up toward the ceiling so your back forms a sagging arch like the back of a cow. 5. Hold this position for 5 seconds.  Press-Ups  Repeat these steps 5-10 times: 1. Lie on your abdomen (face-down) on the floor. 2. Place your palms near your head, about shoulder-width apart. 3. While you keep your back as relaxed as possible and keep your hips on the floor, slowly straighten your arms to raise the top half of your body and lift your shoulders. Do not use your back muscles to raise your upper torso. You may adjust the placement of your hands to make yourself more comfortable. 4. Hold this position for 5 seconds while you keep your back relaxed. 5. Slowly return to lying flat on the floor.  Bridges  Repeat these steps 10 times: 1. Lie on your back on a firm surface. 2. Bend your knees so they are pointing toward the ceiling and your feet are flat on the floor. 3. Tighten your buttocks muscles and lift your buttocks off of the floor until your waist is at almost the same height as your knees. You should feel the muscles working in your  buttocks and the back of your thighs. If you do not feel these muscles, slide your feet 1-2 inches farther away from your buttocks. 4. Hold this position for 3-5 seconds. 5. Slowly lower your hips to the starting position, and allow your buttocks muscles to relax completely.  If this exercise is too easy, try doing it with your arms crossed over your chest. Abdominal Crunches  Repeat these steps 5-10 times: 1. Lie on your back on a firm bed or the floor with your legs extended. 2. Bend your knees so they are pointing toward the ceiling and your feet are flat on the floor. 3. Cross your arms over your chest. 4. Tip your chin slightly toward your chest without bending your neck. 5. Tighten your abdominal muscles and slowly raise your trunk (torso) high enough to lift your shoulder blades a tiny bit off of the floor. Avoid raising your torso higher than  that, because it can put too much stress on your low back and it does not help to strengthen your abdominal muscles. 6. Slowly return to your starting position.  Back Lifts Repeat these steps 5-10 times: 1. Lie on your abdomen (face-down) with your arms at your sides, and rest your forehead on the floor. 2. Tighten the muscles in your legs and your buttocks. 3. Slowly lift your chest off of the floor while you keep your hips pressed to the floor. Keep the back of your head in line with the curve in your back. Your eyes should be looking at the floor. 4. Hold this position for 3-5 seconds. 5. Slowly return to your starting position.  Contact a health care provider if:  Your back pain or discomfort gets much worse when you do an exercise.  Your back pain or discomfort does not lessen within 2 hours after you exercise. If you have any of these problems, stop doing these exercises right away. Do not do them again unless your health care provider says that you can. Get help right away if:  You develop sudden, severe back pain. If this happens, stop doing the exercises right away. Do not do them again unless your health care provider says that you can. This information is not intended to replace advice given to you by your health care provider. Make sure you discuss any questions you have with your health care provider. Document Released: 05/13/2004 Document Revised: 08/13/2015 Document Reviewed: 05/30/2014 Elsevier Interactive Patient Education  2017 [employer].  Continue your excellent hydration and healthy eating. Please try stretching, Epson Salt Baths, and heating pad to help with lower back pain. Please schedule nurse visit for fasting labs. Annual follow-up, sooner if needed. WELCOME TO THE PRACTICE!

## 2016-12-15 NOTE — Progress Notes (Signed)
Subjective:    Patient ID: [name], female    DOB: 08/07/1970, 46 y.o.   MRN: [id number]  HPI:  [name] is here to establish as a new pt.  She is a very healthy, pleasant 46 year old female. PMH: Intermittent lumbar back pain that will occur with prolonged standing/walking.  She denies previous trauma/injury.  She drinks >80 ounces water/day and eats a heart healthy diet.  She is married and had two sons ages 52 and 13.  She works FT as Social research officer, government for [employer].  She has sig family hx of cancer: Sister dx'd with breast ca at age 22 and she passed away at age 68.   Her mother dx'd with colon ca at age 61-in full remission. She denies tobacco/EOTH use.  Patient Care Team    Relationship Specialty Notifications Start End  William Hamburger D, NP PCP - General Family Medicine  12/15/16   Ob/Gyn, Nestor Ramp    12/15/16     Patient Active Problem List   Diagnosis Date Noted  . Healthcare maintenance 12/15/2016  . PAC (premature atrial contraction) 07/12/2011  . DIZZINESS 12/03/2008  . TIREDNESS 12/03/2008  . PALPITATIONS 12/03/2008     Past Medical History:  Diagnosis Date  . Acute appendicitis 2012     Past Surgical History:  Procedure Laterality Date  . APPENDECTOMY    . CESAREAN SECTION  1992, 2005  . LAPAROSCOPIC APPENDECTOMY  2012     Family History  Problem Relation Age of Onset  . Cancer Sister        Breast and brain  . Cancer Mother        colon  . Hypertension Mother   . Hyperlipidemia Father   . Hypertension Father   . Hypothyroidism Father   . Healthy Son   . Alcohol abuse Maternal Uncle   . Cancer Maternal Grandmother        lung  . Hypothyroidism Sister   . Cancer Sister        uterine  . Healthy Son      History  Drug Use No     History  Alcohol Use No     History  Smoking Status  . Never Smoker  Smokeless Tobacco  . Never Used     Outpatient Encounter Prescriptions as of 12/15/2016  Medication Sig  .  ibuprofen (ADVIL,MOTRIN) 200 MG tablet Take 200 mg by mouth as needed.  Marland Kitchen levonorgestrel (MIRENA) 20 MCG/24HR IUD 1 each by Intrauterine route once.  . Multiple Vitamin (MULTIVITAMIN) tablet Take 1 tablet by mouth daily.  Bertram Gala Glycol-Propyl Glycol (SYSTANE OP) Apply 2 drops to eye as needed.  . vitamin B-12 (CYANOCOBALAMIN) 1000 MCG tablet Take 1,000 mcg by mouth daily.  . [DISCONTINUED] JUNEL FE 1.5/30 1.5-30 MG-MCG tablet As directed -birth control   No facility-administered encounter medications on file as of 12/15/2016.     Allergies: Patient has no known allergies.  Body mass index is 26.17 kg/m.  Blood pressure 101/64, pulse 69, height 5' 7.5" (1.715 m), weight 169 lb 9.6 oz (76.9 kg).     Review of Systems  Constitutional: Positive for fatigue. Negative for activity change, appetite change, chills, diaphoresis, fever and unexpected weight change.  HENT: Negative for congestion.   Eyes: Negative for visual disturbance.  Respiratory: Negative for cough, chest tightness, shortness of breath, wheezing and stridor.   Cardiovascular: Negative for chest pain, palpitations and leg swelling.  Gastrointestinal: Negative for abdominal distention, abdominal  pain, anal bleeding, blood in stool, constipation, diarrhea, nausea, rectal pain and vomiting.  Endocrine: Negative for cold intolerance, heat intolerance, polydipsia, polyphagia and polyuria.  Genitourinary: Negative for difficulty urinating and flank pain.  Musculoskeletal: Negative for arthralgias, back pain, gait problem, joint swelling, myalgias, neck pain and neck stiffness.  Skin: Negative for color change, pallor, rash and wound.  Allergic/Immunologic: Negative for immunocompromised state.  Neurological: Negative for dizziness, tremors, weakness and headaches.  Hematological: Does not bruise/bleed easily.  Psychiatric/Behavioral: Negative for decreased concentration, dysphoric mood, hallucinations, self-injury, sleep  disturbance and suicidal ideas. The patient is not nervous/anxious and is not hyperactive.        Objective:   Physical Exam  Constitutional: She is oriented to person, place, and time. She appears well-developed and well-nourished. No distress.  HENT:  Head: Normocephalic and atraumatic.  Right Ear: Hearing, tympanic membrane, external ear and ear canal normal. Tympanic membrane is not erythematous and not bulging. No decreased hearing is noted.  Left Ear: Hearing, tympanic membrane, external ear and ear canal normal. Tympanic membrane is not erythematous and not bulging. No decreased hearing is noted.  Nose: Nose normal. Right sinus exhibits no maxillary sinus tenderness and no frontal sinus tenderness. Left sinus exhibits no maxillary sinus tenderness and no frontal sinus tenderness.  Mouth/Throat: Uvula is midline, oropharynx is clear and moist and mucous membranes are normal.  Eyes: Pupils are equal, round, and reactive to light. Conjunctivae are normal.  Neck: Normal range of motion. Neck supple.  Cardiovascular: Normal rate, regular rhythm, normal heart sounds and intact distal pulses.   No murmur heard. Pulmonary/Chest: Effort normal and breath sounds normal. No respiratory distress. She has no wheezes. She has no rales. She exhibits no tenderness.  Musculoskeletal: Normal range of motion. She exhibits no tenderness.  Lymphadenopathy:    She has no cervical adenopathy.  Neurological: She is alert and oriented to person, place, and time. Coordination normal.  Skin: Skin is warm and dry. No rash noted. She is not diaphoretic. No erythema. No pallor.  Psychiatric: She has a normal mood and affect. Her behavior is normal. Judgment and thought content normal.  Nursing note and vitals reviewed.         Assessment & Plan:   1. Healthcare maintenance   2. PAC (premature atrial contraction)     Healthcare maintenance Continue excellent water intake and heart healthy diet. Please  schedule nurse visit for fasting labs and annual CPE. Continue annual f/u with OG/GYN.  TIREDNESS Will check TSH and Vit d levels.  PAC (premature atrial contraction) Denies current cardiac sx's.    FOLLOW-UP:  Return in about 1 year (around 12/15/2017) for CPE.

## 2016-12-22 ENCOUNTER — Ambulatory Visit
Admission: RE | Admit: 2016-12-22 | Discharge: 2016-12-22 | Disposition: A | Discharge status: Home / Self Care | Payer: BLUE CROSS/BLUE SHIELD | Source: Ambulatory Visit | Attending: Obstetrics and Gynecology | Admitting: Obstetrics and Gynecology

## 2016-12-22 DIAGNOSIS — Z1231 Encounter for screening mammogram for malignant neoplasm of breast: Secondary | ICD-10-CM | POA: Diagnosis not present

## 2016-12-24 ENCOUNTER — Other Ambulatory Visit (INDEPENDENT_AMBULATORY_CARE_PROVIDER_SITE_OTHER): Payer: BLUE CROSS/BLUE SHIELD

## 2016-12-24 DIAGNOSIS — Z0000 Encounter for general adult medical examination without abnormal findings: Secondary | ICD-10-CM | POA: Diagnosis not present

## 2016-12-24 DIAGNOSIS — R5383 Other fatigue: Secondary | ICD-10-CM

## 2016-12-24 DIAGNOSIS — R002 Palpitations: Secondary | ICD-10-CM

## 2016-12-24 DIAGNOSIS — I491 Atrial premature depolarization: Secondary | ICD-10-CM | POA: Diagnosis not present

## 2016-12-24 DIAGNOSIS — R42 Dizziness and giddiness: Secondary | ICD-10-CM | POA: Diagnosis not present

## 2016-12-25 LAB — COMPREHENSIVE METABOLIC PANEL
A/G RATIO: 2 (ref 1.2–2.2)
ALT: 13 IU/L (ref 0–32)
AST: 16 IU/L (ref 0–40)
Albumin: 4.3 g/dL (ref 3.5–5.5)
Alkaline Phosphatase: 83 IU/L (ref 39–117)
BUN/Creatinine Ratio: 19 (ref 9–23)
BUN: 15 mg/dL (ref 6–24)
Bilirubin Total: 0.6 mg/dL (ref 0.0–1.2)
CALCIUM: 9 mg/dL (ref 8.7–10.2)
CO2: 22 mmol/L (ref 20–29)
Chloride: 102 mmol/L (ref 96–106)
Creatinine, Ser: 0.79 mg/dL (ref 0.57–1.00)
GFR, EST AFRICAN AMERICAN: 103 mL/min/{1.73_m2} (ref >59)
GFR, EST NON AFRICAN AMERICAN: 89 mL/min/{1.73_m2} (ref >59)
GLUCOSE: 93 mg/dL (ref 65–99)
Globulin, Total: 2.2 g/dL (ref 1.5–4.5)
POTASSIUM: 4.4 mmol/L (ref 3.5–5.2)
Sodium: 140 mmol/L (ref 134–144)
TOTAL PROTEIN: 6.5 g/dL (ref 6.0–8.5)

## 2016-12-25 LAB — VITAMIN D 25 HYDROXY (VIT D DEFICIENCY, FRACTURES): VIT D 25 HYDROXY: 29.2 ng/mL — AB (ref 30.0–100.0)

## 2016-12-25 LAB — CBC WITH DIFFERENTIAL
BASOS ABS: 0 10*3/uL (ref 0.0–0.2)
BASOS: 1 %
EOS (ABSOLUTE): 0.1 10*3/uL (ref 0.0–0.4)
Eos: 3 %
Hematocrit: 37.4 % (ref 34.0–46.6)
Hemoglobin: 12.7 g/dL (ref 11.1–15.9)
IMMATURE GRANS (ABS): 0 10*3/uL (ref 0.0–0.1)
IMMATURE GRANULOCYTES: 0 %
LYMPHS: 27 %
Lymphocytes Absolute: 1.3 10*3/uL (ref 0.7–3.1)
MCH: 33 pg (ref 26.6–33.0)
MCHC: 34 g/dL (ref 31.5–35.7)
MCV: 97 fL (ref 79–97)
MONOS ABS: 0.3 10*3/uL (ref 0.1–0.9)
Monocytes: 7 %
NEUTROS PCT: 62 %
Neutrophils Absolute: 3 10*3/uL (ref 1.4–7.0)
RBC: 3.85 x10E6/uL (ref 3.77–5.28)
RDW: 13.9 % (ref 12.3–15.4)
WBC: 4.7 10*3/uL (ref 3.4–10.8)

## 2016-12-25 LAB — LIPID PANEL
CHOL/HDL RATIO: 2.5 ratio (ref 0.0–4.4)
Cholesterol, Total: 157 mg/dL (ref 100–199)
HDL: 62 mg/dL (ref >39)
LDL Calculated: 88 mg/dL (ref 0–99)
Triglycerides: 33 mg/dL (ref 0–149)
VLDL CHOLESTEROL CAL: 7 mg/dL (ref 5–40)

## 2016-12-25 LAB — TSH: TSH: 3 u[IU]/mL (ref 0.450–4.500)

## 2016-12-25 LAB — HEMOGLOBIN A1C
Est. average glucose Bld gHb Est-mCnc: 103 mg/dL
Hgb A1c MFr Bld: 5.2 % (ref 4.8–5.6)

## 2016-12-28 ENCOUNTER — Other Ambulatory Visit: Payer: Self-pay | Admitting: Adult Health

## 2016-12-28 DIAGNOSIS — E559 Vitamin D deficiency, unspecified: Secondary | ICD-10-CM

## 2016-12-28 MED ORDER — VITAMIN D (ERGOCALCIFEROL) 1.25 MG (50000 UNIT) PO CAPS: 50000.0000 [IU] | ORAL_CAPSULE | ORAL | 0 refills | Status: DC

## 2016-12-29 ENCOUNTER — Telehealth: Payer: Self-pay | Admitting: Adult Health

## 2016-12-29 NOTE — Telephone Encounter (Signed)
Pt states the information she received from the pharmacy for her Vitamin D RX said that this medication is used to treat thyroid disorders.  Advised pt that we are prescribing this medication for a low vitamin D level and not for a thyroid disorder.  Pt expressed understanding.  Tiajuana Amass. Masaye Gatchalian, CMA

## 2016-12-29 NOTE — Telephone Encounter (Signed)
Patient called and left VM to return call about labs, she can reached today on her mobile [phone])

## 2016-12-29 NOTE — Telephone Encounter (Signed)
Patient has some clinical questions about her Vit D prescription

## 2017-04-28 DIAGNOSIS — Z6826 Body mass index (BMI) 26.0-26.9, adult: Secondary | ICD-10-CM | POA: Diagnosis not present

## 2017-04-28 DIAGNOSIS — Z01419 Encounter for gynecological examination (general) (routine) without abnormal findings: Secondary | ICD-10-CM | POA: Diagnosis not present

## 2017-04-28 DIAGNOSIS — Z124 Encounter for screening for malignant neoplasm of cervix: Secondary | ICD-10-CM | POA: Diagnosis not present

## 2017-04-28 LAB — HM PAP SMEAR: HM PAP: NEGATIVE

## 2017-05-02 ENCOUNTER — Other Ambulatory Visit: Payer: BLUE CROSS/BLUE SHIELD

## 2017-05-02 DIAGNOSIS — E559 Vitamin D deficiency, unspecified: Secondary | ICD-10-CM

## 2017-05-03 LAB — VITAMIN D 25 HYDROXY (VIT D DEFICIENCY, FRACTURES): Vit D, 25-Hydroxy: 36.8 ng/mL (ref 30.0–100.0)

## 2017-08-24 ENCOUNTER — Encounter: Payer: Self-pay | Admitting: Family Medicine

## 2017-08-24 ENCOUNTER — Ambulatory Visit (INDEPENDENT_AMBULATORY_CARE_PROVIDER_SITE_OTHER): Payer: BLUE CROSS/BLUE SHIELD | Admitting: Family Medicine

## 2017-08-24 VITALS — BP 120/82 | HR 62 | Ht 67.5 in | Wt 164.0 lb

## 2017-08-24 DIAGNOSIS — M26609 Unspecified temporomandibular joint disorder, unspecified side: Secondary | ICD-10-CM

## 2017-08-24 NOTE — Progress Notes (Signed)
Pt here for an acute care OV today   Impression and Recommendations:    1. TMJ (temporomandibular joint syndrome)-  R     1. TMJ -Pt declines PT referral at this time but will see her dentist regarding this issue. She prefers to watch youtube videos for exercises she could try to alleviate her symptoms.  -Apply ice to the area, 3-4 times a day for 15-20 minutes a day.  -continue taking ibuprofen OTC for pain relief. Cautioned pt about the prolonged use of ibuprofen.    Education and routine counseling performed. Handouts provided  Gross side effects, risk and benefits, and alternatives of medications and treatment plan in general discussed with patient.  Patient is aware that all medications have potential side effects and we are unable to predict every side effect or drug-drug interaction that may occur.   Patient will call with any questions prior to using medication if they have concerns.  Expresses verbal understanding and consents to current therapy and treatment regimen.  No barriers to understanding were identified.  Red flag symptoms and signs discussed in detail.  Patient expressed understanding regarding what to do in case of emergency\urgent symptoms   Please see AVS handed out to patient at the end of our visit for further patient instructions/ counseling done pertaining to today's office visit.   Return if symptoms worsen or fail to improve, for Follow-up with [facility] your PCP as you previously discussed with her.     Note: This document was prepared occasionally using Dragon voice recognition software and may include unintentional dictation errors in addition to a scribe.   This document serves as a record of services personally performed by [name], DO. It was created on her behalf by Thelma Barge, a trained medical scribe. The creation of this record is based on the scribe's personal observations and the provider's statements to them.   I have reviewed the  above medical documentation for accuracy and completeness and I concur.  [name] 08/24/17 5:39 PM   --------------------------------------------------------------------------------------------------------------------------------------------------------------------------    Subjective:    CC:  Chief Complaint  Patient presents with  . Jaw Pain    right jaw and ear pain    HPI: [name] is a 47 y.o. female who presents to [facility] today for issues as discussed below.  She complains of R sided jaw pain that began 5 weeks ago. She states she yawned and her jaw popped. Since then, she has had R jaw pain that radiates to her R ear, neck, and head. She has taken ibuprofen with mild relief. She has not iced the area.   Nothing makes her pain better. Chewing makes her pain worse. She last saw her dentist 4-5 months, she goes q 6 months.   She started a new job and reports increased stress recently.   She has no h/o kidney disease.   Problem  TMJ (Temporomandibular Joint Syndrome)     Wt Readings from Last 3 Encounters:  08/24/17 164 lb (74.4 kg)  12/15/16 169 lb 9.6 oz (76.9 kg)  07/12/11 171 lb 6.4 oz (77.7 kg)   BP Readings from Last 3 Encounters:  08/24/17 120/82  12/15/16 101/64  07/12/11 110/68   BMI Readings from Last 3 Encounters:  08/24/17 25.31 kg/m  12/15/16 26.17 kg/m  07/12/11 26.45 kg/m     Patient Care Team    Relationship Specialty Notifications Start End  Julaine Fusi, NP PCP -  General Family Medicine  12/15/16   Ob/Gyn, Nestor Ramp    12/15/16      Patient Active Problem List   Diagnosis Date Noted  . TMJ (temporomandibular joint syndrome) 08/24/2017  . Healthcare maintenance 12/15/2016  . PAC (premature atrial contraction) 07/12/2011  . DIZZINESS 12/03/2008  . TIREDNESS 12/03/2008  . PALPITATIONS 12/03/2008    Past Medical history, Surgical history, Family history, Social history, Allergies  and Medications have been entered into the medical record, reviewed and changed as needed.    Current Meds  Medication Sig  . ibuprofen (ADVIL,MOTRIN) 200 MG tablet Take 200 mg by mouth as needed.  Marland Kitchen levonorgestrel (MIRENA) 20 MCG/24HR IUD 1 each by Intrauterine route once.  . Multiple Vitamin (MULTIVITAMIN) tablet Take 1 tablet by mouth daily.  Bertram Gala Glycol-Propyl Glycol (SYSTANE OP) Apply 2 drops to eye as needed.  . vitamin B-12 (CYANOCOBALAMIN) 1000 MCG tablet Take 1,000 mcg by mouth daily.  . Vitamin D, Ergocalciferol, (DRISDOL) 50000 units CAPS capsule Take 1 capsule (50,000 Units total) by mouth every 7 (seven) days.    Allergies:  No Known Allergies   Review of Systems: General:   Denies fever, chills, unexplained weight loss.  Optho/Auditory:   Denies visual changes, blurred vision/LOV Respiratory:   Denies wheeze, DOE more than baseline levels.  Cardiovascular:   Denies chest pain, palpitations, new onset peripheral edema  Gastrointestinal:   Denies nausea, vomiting, diarrhea, abd pain.  Genitourinary: Denies dysuria, freq/ urgency, flank pain or discharge from genitals.  Endocrine:     Denies hot or cold intolerance, polyuria, polydipsia. Musculoskeletal:   Denies unexplained myalgias, joint swelling, unexplained arthralgias, gait problems.  Skin:  Denies new onset rash, suspicious lesions Neurological:     Denies dizziness, unexplained weakness, numbness  Psychiatric/Behavioral:   Denies mood changes, suicidal or homicidal ideations, hallucinations    Objective:   Blood pressure 120/82, pulse 62, height 5' 7.5" (1.715 m), weight 164 lb (74.4 kg), SpO2 100 %. Body mass index is 25.31 kg/m. General:  Well Developed, well nourished, appropriate for stated age.  Neuro:  Alert and oriented,  extra-ocular muscles intact  HEENT:  Normocephalic, atraumatic, neck supple Oral: TMJs bilaterally without clicking or abnormality.  No dislocation noted.  Masseter muscle on  the right more boggy and tighter than left.  Otherwise no oral lesions or masses.  Neck supple without masses. Skin:  no gross rash, warm, pink. Vascular:  Ext warm, no cyanosis apprec.; cap RF less 2 sec. Psych:  No HI/SI, judgement and insight good, Euthymic mood. Full Affect.

## 2017-08-24 NOTE — Patient Instructions (Addendum)
-Also please see if we have a handout on TMJ for the patient in our sports med advisor  - If you change your mind on the referral to physical therapy for this condition please let your PCP know. - Please follow-up with your dentist regarding evaluation for right-sided temporomandibular joint dysfunction.  Ask about mouthguard and see if that would be appropriate  Temporomandibular Joint Syndrome Temporomandibular joint (TMJ) syndrome is a condition that affects the joints between your jaw and your skull. The TMJs are located near your ears and allow your jaw to open and close. These joints and the nearby muscles are involved in all movements of the jaw. People with TMJ syndrome have pain in the area of these joints and muscles. Chewing, biting, or other movements of the jaw can be difficult or painful. TMJ syndrome can be caused by various things. In many cases, the condition is mild and goes away within a few weeks. For some people, the condition can become a long-term problem. What are the causes? Possible causes of TMJ syndrome include:  Grinding your teeth or clenching your jaw. Some people do this when they are under stress.  Arthritis.  Injury to the jaw.  Head or neck injury.  Teeth or dentures that are not aligned well.  In some cases, the cause of TMJ syndrome may not be known. What are the signs or symptoms? The most common symptom is an aching pain on the side of the head in the area of the TMJ. Other symptoms may include:  Pain when moving your jaw, such as when chewing or biting.  Being unable to open your jaw all the way.  Making a clicking sound when you open your mouth.  Headache.  Earache.  Neck or shoulder pain.  How is this diagnosed? Diagnosis can usually be made based on your symptoms, your medical history, and a physical exam. Your health care provider may check the range of motion of your jaw. Imaging tests, such as X-rays or an MRI, are sometimes done.  You may need to see your dentist to determine if your teeth and jaw are lined up correctly. How is this treated? TMJ syndrome often goes away on its own. If treatment is needed, the options may include:  Eating soft foods and applying ice or heat.  Medicines to relieve pain or inflammation.  Medicines to relax the muscles.  A splint, bite plate, or mouthpiece to prevent teeth grinding or jaw clenching.  Relaxation techniques or counseling to help reduce stress.  Transcutaneous electrical nerve stimulation (TENS). This helps to relieve pain by applying an electrical current through the skin.  Acupuncture. This is sometimes helpful to relieve pain.  Jaw surgery. This is rarely needed.  Follow these instructions at home:  Take medicines only as directed by your health care provider.  Eat a soft diet if you are having trouble chewing.  Apply ice to the painful area. ? Put ice in a plastic bag. ? Place a towel between your skin and the bag. ? Leave the ice on for 20 minutes, 2-3 times a day.  Apply a warm compress to the painful area as directed.  Massage your jaw area and perform any jaw stretching exercises as recommended by your health care provider.  If you were given a mouthpiece or bite plate, wear it as directed.  Avoid foods that require a lot of chewing. Do not chew gum.  Keep all follow-up visits as directed by your health care provider.  This is important. Contact a health care provider if:  You are having trouble eating.  You have new or worsening symptoms. Get help right away if:  Your jaw locks open or closed. This information is not intended to replace advice given to you by your health care provider. Make sure you discuss any questions you have with your health care provider. Document Released: 12/29/2000 Document Revised: 12/04/2015 Document Reviewed: 11/08/2013 Elsevier Interactive Patient Education  [employer].

## 2017-12-14 NOTE — Progress Notes (Deleted)
Subjective:    Patient ID: [name], female    DOB: [birth year], 47 y.o.   MRN: [id number]  HPI:  12/15/17 OV: [name] is here to establish as a new pt.  She is a very healthy, pleasant 47 year old female. PMH: Intermittent lumbar back pain that will occur with prolonged standing/walking.  She denies previous trauma/injury.  She drinks >80 ounces water/day and eats a heart healthy diet.  She is married and had two sons ages 48 and 36.  She works FT as Social research officer, government for [employer].  She has sig family hx of cancer: Sister dx'd with breast ca at age 37 and she passed away at age 26.   Her mother dx'd with colon ca at age 86-in full remission. She denies tobacco/EOTH use.  12/14/17 OV: [name] presents for CPE   Of Note- she was seen May 2019 for TMJ- given exercises to perform, declined PT referral  Healthcare Maintenance: PAP- Mammogram- Colonoscopy- Immunizations-   Patient Care Team    Relationship Specialty Notifications Start End  Julaine Fusi, NP PCP - General Family Medicine  12/15/16   Ob/Gyn, Nestor Ramp    12/15/16     Patient Active Problem List   Diagnosis Date Noted  . TMJ (temporomandibular joint syndrome) 08/24/2017  . Healthcare maintenance 12/15/2016  . PAC (premature atrial contraction) 07/12/2011  . DIZZINESS 12/03/2008  . TIREDNESS 12/03/2008  . PALPITATIONS 12/03/2008     Past Medical History:  Diagnosis Date  . Acute appendicitis 2012     Past Surgical History:  Procedure Laterality Date  . APPENDECTOMY    . CESAREAN SECTION  1992, 2005  . LAPAROSCOPIC APPENDECTOMY  2012     Family History  Problem Relation Age of Onset  . Cancer Sister        Breast and brain  . Breast cancer Sister 61  . Cancer Mother        colon  . Hypertension Mother   . Hyperlipidemia Father   . Hypertension Father   . Hypothyroidism Father   . Healthy Son   . Alcohol abuse Maternal Uncle   . Cancer Maternal Grandmother        lung   . Hypothyroidism Sister   . Cancer Sister        uterine  . Healthy Son      Social History   Substance and Sexual Activity  Drug Use No     Social History   Substance and Sexual Activity  Alcohol Use No     Social History   Tobacco Use  Smoking Status Never Smoker  Smokeless Tobacco Never Used     Outpatient Encounter Medications as of 12/15/2017  Medication Sig  . ibuprofen (ADVIL,MOTRIN) 200 MG tablet Take 200 mg by mouth as needed.  Marland Kitchen levonorgestrel (MIRENA) 20 MCG/24HR IUD 1 each by Intrauterine route once.  . Multiple Vitamin (MULTIVITAMIN) tablet Take 1 tablet by mouth daily.  Bertram Gala Glycol-Propyl Glycol (SYSTANE OP) Apply 2 drops to eye as needed.  . vitamin B-12 (CYANOCOBALAMIN) 1000 MCG tablet Take 1,000 mcg by mouth daily.  . Vitamin D, Ergocalciferol, (DRISDOL) 50000 units CAPS capsule Take 1 capsule (50,000 Units total) by mouth every 7 (seven) days.   No facility-administered encounter medications on file as of 12/15/2017.     Allergies: Patient has no known allergies.  There is no height or weight on file to calculate BMI.  There were no vitals taken for this  visit.     Review of Systems  Constitutional: Positive for fatigue. Negative for activity change, appetite change, chills, diaphoresis, fever and unexpected weight change.  HENT: Negative for congestion.   Eyes: Negative for visual disturbance.  Respiratory: Negative for cough, chest tightness, shortness of breath, wheezing and stridor.   Cardiovascular: Negative for chest pain, palpitations and leg swelling.  Gastrointestinal: Negative for abdominal distention, abdominal pain, anal bleeding, blood in stool, constipation, diarrhea, nausea, rectal pain and vomiting.  Endocrine: Negative for cold intolerance, heat intolerance, polydipsia, polyphagia and polyuria.  Genitourinary: Negative for difficulty urinating and flank pain.  Musculoskeletal: Negative for arthralgias, back pain,  gait problem, joint swelling, myalgias, neck pain and neck stiffness.  Skin: Negative for color change, pallor, rash and wound.  Allergic/Immunologic: Negative for immunocompromised state.  Neurological: Negative for dizziness, tremors, weakness and headaches.  Hematological: Does not bruise/bleed easily.  Psychiatric/Behavioral: Negative for decreased concentration, dysphoric mood, hallucinations, self-injury, sleep disturbance and suicidal ideas. The patient is not nervous/anxious and is not hyperactive.        Objective:   Physical Exam  Constitutional: She is oriented to person, place, and time. She appears well-developed and well-nourished. No distress.  HENT:  Head: Normocephalic and atraumatic.  Right Ear: Hearing, tympanic membrane, external ear and ear canal normal. Tympanic membrane is not erythematous and not bulging. No decreased hearing is noted.  Left Ear: Hearing, tympanic membrane, external ear and ear canal normal. Tympanic membrane is not erythematous and not bulging. No decreased hearing is noted.  Nose: Nose normal. Right sinus exhibits no maxillary sinus tenderness and no frontal sinus tenderness. Left sinus exhibits no maxillary sinus tenderness and no frontal sinus tenderness.  Mouth/Throat: Uvula is midline, oropharynx is clear and moist and mucous membranes are normal.  Eyes: Pupils are equal, round, and reactive to light. Conjunctivae are normal.  Neck: Normal range of motion. Neck supple.  Cardiovascular: Normal rate, regular rhythm, normal heart sounds and intact distal pulses.  No murmur heard. Pulmonary/Chest: Effort normal and breath sounds normal. No respiratory distress. She has no wheezes. She has no rales. She exhibits no tenderness.  Musculoskeletal: Normal range of motion. She exhibits no tenderness.  Lymphadenopathy:    She has no cervical adenopathy.  Neurological: She is alert and oriented to person, place, and time. Coordination normal.  Skin: Skin  is warm and dry. No rash noted. She is not diaphoretic. No erythema. No pallor.  Psychiatric: She has a normal mood and affect. Her behavior is normal. Judgment and thought content normal.  Nursing note and vitals reviewed.         Assessment & Plan:   No diagnosis found.  No problem-specific Assessment & Plan notes found for this encounter.    FOLLOW-UP:  No follow-ups on file.

## 2017-12-15 ENCOUNTER — Encounter: Payer: BLUE CROSS/BLUE SHIELD | Admitting: Adult Health

## 2018-01-12 NOTE — Progress Notes (Signed)
Subjective:    Patient ID: [name], female    DOB: 1971/04/18, 47 y.o.   MRN: [id number]  HPI: 12/15/16 OV:  [name] is here to establish as a new pt.  She is a very healthy, pleasant 47 year old female. PMH: Intermittent lumbar back pain that will occur with prolonged standing/walking.  She denies previous trauma/injury.  She drinks >80 ounces water/day and eats a heart healthy diet.  She is married and had two sons ages 58 and 45.  She works FT as Social research officer, government for [employer].  She has sig family hx of cancer: Sister dx'd with breast ca at age 38 and she passed away at age 15.   Her mother dx'd with colon ca at age 75-in full remission. She denies tobacco/EOTH use.  01/16/18 OV: [name] presents for CPE She continues to drink >80 ounces water/day and follows heart healthy diet She enjoys walking several times/week She continues to abstain and from tobacco/ETOH  She has one acute complaint- increased "moving legs at night that wakes my several times a night". She reports RLS sx's progressively worsening >3 years and estimates sx's to occur 2-3 nights per week and cause her to typically wake 0100, 0300, 0430 which will result in sig fatigue the following day. She has never been on Gabapentin for sx management Fasting labs obtained today  Healthcare Maintenance: PAP-UTD, Jan 2019, scanned in chart Mammogram-ordered  Immunizations- declined Tdap today  Patient Care Team    Relationship Specialty Notifications Start End  William Hamburger D, NP PCP - General Family Medicine  12/15/16   Ob/Gyn, Nestor Ramp    12/15/16     Patient Active Problem List   Diagnosis Date Noted  . Restless legs syndrome (RLS) 01/16/2018  . TMJ (temporomandibular joint syndrome) 08/24/2017  . Healthcare maintenance 12/15/2016  . PAC (premature atrial contraction) 07/12/2011  . DIZZINESS 12/03/2008  . TIREDNESS 12/03/2008  . PALPITATIONS 12/03/2008     Past Medical History:    Diagnosis Date  . Acute appendicitis 2012     Past Surgical History:  Procedure Laterality Date  . APPENDECTOMY    . CESAREAN SECTION  1992, 2005  . LAPAROSCOPIC APPENDECTOMY  2012     Family History  Problem Relation Age of Onset  . Cancer Sister        Breast and brain  . Breast cancer Sister 34  . Cancer Mother        colon  . Hypertension Mother   . Hyperlipidemia Father   . Hypertension Father   . Hypothyroidism Father   . Healthy Son   . Alcohol abuse Maternal Uncle   . Cancer Maternal Grandmother        lung  . Hypothyroidism Sister   . Cancer Sister        uterine  . Healthy Son      Social History   Substance and Sexual Activity  Drug Use No     Social History   Substance and Sexual Activity  Alcohol Use No     Social History   Tobacco Use  Smoking Status Never Smoker  Smokeless Tobacco Never Used     Outpatient Encounter Medications as of 01/16/2018  Medication Sig  . Cholecalciferol (VITAMIN D3) 2000 units TABS Take 1 capsule by mouth daily.  Marland Kitchen ibuprofen (ADVIL,MOTRIN) 200 MG tablet Take 200 mg by mouth as needed.  Marland Kitchen levonorgestrel (MIRENA) 20 MCG/24HR IUD 1 each by Intrauterine route once.  Marland Kitchen  Multiple Vitamin (MULTIVITAMIN) tablet Take 1 tablet by mouth daily.  . Multiple Vitamins-Minerals (ADULT ONE DAILY GUMMIES PO) Take 1 each by mouth daily.  Bertram Gala Glycol-Propyl Glycol (SYSTANE OP) Apply 2 drops to eye as needed.  . vitamin B-12 (CYANOCOBALAMIN) 1000 MCG tablet Take 1,000 mcg by mouth daily.  Marland Kitchen gabapentin (NEURONTIN) 100 MG capsule 1-2 capsules at bedtime as needed for restless leg syndrome  . [DISCONTINUED] Vitamin D, Ergocalciferol, (DRISDOL) 50000 units CAPS capsule Take 1 capsule (50,000 Units total) by mouth every 7 (seven) days.   No facility-administered encounter medications on file as of 01/16/2018.     Allergies: Patient has no known allergies.  Body mass index is 25.48 kg/m.  Blood pressure 120/77, pulse  67, height 5' 7.5" (1.715 m), weight 165 lb 1.6 oz (74.9 kg), SpO2 100 %.    Review of Systems  Constitutional: Positive for fatigue. Negative for activity change, appetite change, chills, diaphoresis, fever and unexpected weight change.  HENT: Negative for congestion.   Eyes: Negative for visual disturbance.  Respiratory: Negative for cough, chest tightness, shortness of breath, wheezing and stridor.   Cardiovascular: Negative for chest pain, palpitations and leg swelling.  Gastrointestinal: Negative for abdominal distention, abdominal pain, anal bleeding, blood in stool, constipation, diarrhea, nausea, rectal pain and vomiting.  Endocrine: Negative for cold intolerance, heat intolerance, polydipsia, polyphagia and polyuria.  Genitourinary: Negative for difficulty urinating and flank pain.  Musculoskeletal: Negative for arthralgias, back pain, gait problem, joint swelling, myalgias, neck pain and neck stiffness.  Skin: Negative for color change, pallor, rash and wound.  Allergic/Immunologic: Negative for immunocompromised state.  Neurological: Negative for dizziness, tremors, weakness and headaches.  Hematological: Does not bruise/bleed easily.  Psychiatric/Behavioral: Negative for decreased concentration, dysphoric mood, hallucinations, self-injury, sleep disturbance and suicidal ideas. The patient is not nervous/anxious and is not hyperactive.        Objective:   Physical Exam  Constitutional: She is oriented to person, place, and time. She appears well-developed and well-nourished. No distress.  HENT:  Head: Normocephalic and atraumatic.  Right Ear: Hearing, tympanic membrane, external ear and ear canal normal. Tympanic membrane is not erythematous and not bulging. No decreased hearing is noted.  Left Ear: Hearing, tympanic membrane, external ear and ear canal normal. Tympanic membrane is not erythematous and not bulging. No decreased hearing is noted.  Nose: Nose normal. No mucosal  edema. Right sinus exhibits no maxillary sinus tenderness and no frontal sinus tenderness. Left sinus exhibits no maxillary sinus tenderness and no frontal sinus tenderness.  Mouth/Throat: Uvula is midline, oropharynx is clear and moist and mucous membranes are normal. No oropharyngeal exudate, posterior oropharyngeal edema, posterior oropharyngeal erythema or tonsillar abscesses.  Eyes: Pupils are equal, round, and reactive to light. Conjunctivae are normal.  Neck: Normal range of motion. Neck supple. No thyromegaly present.  Cardiovascular: Normal rate, regular rhythm, normal heart sounds and intact distal pulses.  No murmur heard. Pulmonary/Chest: Effort normal and breath sounds normal. No respiratory distress. She has no decreased breath sounds. She has no wheezes. She has no rhonchi. She has no rales. She exhibits no tenderness. Right breast exhibits no inverted nipple, no mass, no nipple discharge, no skin change and no tenderness. Left breast exhibits no inverted nipple, no mass, no nipple discharge, no skin change and no tenderness. Breasts are symmetrical.    Palpable, non-tender R axillary lymph nodes- been present for years per pt  Abdominal: Soft. Bowel sounds are normal. She exhibits no distension and no mass.  There is no tenderness. There is no rebound and no guarding.  Musculoskeletal: Normal range of motion. She exhibits no tenderness.  Lymphadenopathy:    She has no cervical adenopathy.  Neurological: She is alert and oriented to person, place, and time. Coordination normal.  Skin: Skin is warm and dry. No rash noted. She is not diaphoretic. No erythema. No pallor.  Psychiatric: She has a normal mood and affect. Her behavior is normal. Judgment and thought content normal.  Nursing note and vitals reviewed.     Assessment & Plan:   1. Screening for breast cancer   2. Healthcare maintenance   3. Restless legs syndrome (RLS)     Healthcare maintenance Continue to drink  plenty of water and follow Mediterranean diet. Increase regular exercise, ie walking, biking, yoga, stretching. We will call you when lab results are available, Recommend annual follow-up, sooner if needed.  Restless legs syndrome (RLS) Worsening sx's >3 years Sx's to occur 2-3 nights per week and cause her to typically wake 0100, 0300, 0430 which will result in sig fatigue the following day. Started on Gabapentin 100mg  capsule: 1-2 cap QHS PRN  FOLLOW-UP:  Return in about 1 year (around 01/17/2019) for CPE, Fasting Labs.

## 2018-01-16 ENCOUNTER — Encounter: Payer: Self-pay | Admitting: Adult Health

## 2018-01-16 ENCOUNTER — Ambulatory Visit (INDEPENDENT_AMBULATORY_CARE_PROVIDER_SITE_OTHER): Payer: BLUE CROSS/BLUE SHIELD | Admitting: Adult Health

## 2018-01-16 VITALS — BP 120/77 | HR 67 | Ht 67.5 in | Wt 165.1 lb

## 2018-01-16 DIAGNOSIS — Z1239 Encounter for other screening for malignant neoplasm of breast: Secondary | ICD-10-CM

## 2018-01-16 DIAGNOSIS — G2581 Restless legs syndrome: Secondary | ICD-10-CM | POA: Diagnosis not present

## 2018-01-16 DIAGNOSIS — Z0000 Encounter for general adult medical examination without abnormal findings: Secondary | ICD-10-CM | POA: Diagnosis not present

## 2018-01-16 DIAGNOSIS — Z1231 Encounter for screening mammogram for malignant neoplasm of breast: Secondary | ICD-10-CM | POA: Diagnosis not present

## 2018-01-16 MED ORDER — GABAPENTIN 100 MG PO CAPS: ORAL_CAPSULE | ORAL | 0 refills | Status: DC

## 2018-01-16 NOTE — Assessment & Plan Note (Signed)
Worsening sx's >3 years Sx's to occur 2-3 nights per week and cause her to typically wake 0100, 0300, 0430 which will result in sig fatigue the following day. Started on Gabapentin 100mg  capsule: 1-2 cap QHS PRN

## 2018-01-16 NOTE — Patient Instructions (Signed)
Preventive Care for Adults, Female  A healthy lifestyle and preventive care can promote health and wellness. Preventive health guidelines for women include the following key practices.   A routine yearly physical is a good way to check with your health care provider about your health and preventive screening. It is a chance to share any concerns and updates on your health and to receive a thorough exam.   Visit your dentist for a routine exam and preventive care every 6 months. Brush your teeth twice a day and floss once a day. Good oral hygiene prevents tooth decay and gum disease.   The frequency of eye exams is based on your age, health, family medical history, use of contact lenses, and other factors. Follow your health care provider's recommendations for frequency of eye exams.   Eat a healthy diet. Foods like vegetables, fruits, whole grains, low-fat dairy products, and lean protein foods contain the nutrients you need without too many calories. Decrease your intake of foods high in solid fats, added sugars, and salt. Eat the right amount of calories for you.Get information about a proper diet from your health care provider, if necessary.   Regular physical exercise is one of the most important things you can do for your health. Most adults should get at least 150 minutes of moderate-intensity exercise (any activity that increases your heart rate and causes you to sweat) each week. In addition, most adults need muscle-strengthening exercises on 2 or more days a week.   Maintain a healthy weight. The body mass index (BMI) is a screening tool to identify possible weight problems. It provides an estimate of body fat based on height and weight. Your health care provider can find your BMI, and can help you achieve or maintain a healthy weight.For adults 20 years and older:   - A BMI below 18.5 is considered underweight.   - A BMI of 18.5 to 24.9 is normal.   - A BMI of 25 to 29.9 is  considered overweight.   - A BMI of 30 and above is considered obese.   Maintain normal blood lipids and cholesterol levels by exercising and minimizing your intake of trans and saturated fats.  Eat a balanced diet with plenty of fruit and vegetables. Blood tests for lipids and cholesterol should begin at age 20 and be repeated every 5 years minimum.  If your lipid or cholesterol levels are high, you are over 40, or you are at high risk for heart disease, you may need your cholesterol levels checked more frequently.Ongoing high lipid and cholesterol levels should be treated with medicines if diet and exercise are not working.   If you smoke, find out from your health care provider how to quit. If you do not use tobacco, do not start.   Lung cancer screening is recommended for adults aged 55-80 years who are at high risk for developing lung cancer because of a history of smoking. A yearly low-dose CT scan of the lungs is recommended for people who have at least a 30-pack-year history of smoking and are a current smoker or have quit within the past 15 years. A pack year of smoking is smoking an average of 1 pack of cigarettes a day for 1 year (for example: 1 pack a day for 30 years or 2 packs a day for 15 years). Yearly screening should continue until the smoker has stopped smoking for at least 15 years. Yearly screening should be stopped for people who develop a   health problem that would prevent them from having lung cancer treatment.   If you are pregnant, do not drink alcohol. If you are breastfeeding, be very cautious about drinking alcohol. If you are not pregnant and choose to drink alcohol, do not have more than 1 drink per day. One drink is considered to be 12 ounces (355 mL) of beer, 5 ounces (148 mL) of wine, or 1.5 ounces (44 mL) of liquor.   Avoid use of street drugs. Do not share needles with anyone. Ask for help if you need support or instructions about stopping the use of  drugs.   High blood pressure causes heart disease and increases the risk of stroke. Your blood pressure should be checked at least yearly.  Ongoing high blood pressure should be treated with medicines if weight loss and exercise do not work.   If you are 69-55 years old, ask your health care provider if you should take aspirin to prevent strokes.   Diabetes screening involves taking a blood sample to check your fasting blood sugar level. This should be done once every 3 years, after age 38, if you are within normal weight and without risk factors for diabetes. Testing should be considered at a younger age or be carried out more frequently if you are overweight and have at least 1 risk factor for diabetes.   Breast cancer screening is essential preventive care for women. You should practice "breast self-awareness."  This means understanding the normal appearance and feel of your breasts and may include breast self-examination.  Any changes detected, no matter how small, should be reported to a health care provider.  Women in their 80s and 30s should have a clinical breast exam (CBE) by a health care provider as part of a regular health exam every 1 to 3 years.  After age 66, women should have a CBE every year.  Starting at age 1, women should consider having a mammogram (breast X-ray test) every year.  Women who have a family history of breast cancer should talk to their health care provider about genetic screening.  Women at a high risk of breast cancer should talk to their health care providers about having an MRI and a mammogram every year.   -Breast cancer gene (BRCA)-related cancer risk assessment is recommended for women who have family members with BRCA-related cancers. BRCA-related cancers include breast, ovarian, tubal, and peritoneal cancers. Having family members with these cancers may be associated with an increased risk for harmful changes (mutations) in the breast cancer genes BRCA1 and  BRCA2. Results of the assessment will determine the need for genetic counseling and BRCA1 and BRCA2 testing.   The Pap test is a screening test for cervical cancer. A Pap test can show cell changes on the cervix that might become cervical cancer if left untreated. A Pap test is a procedure in which cells are obtained and examined from the lower end of the uterus (cervix).   - Women should have a Pap test starting at age 57.   - Between ages 90 and 70, Pap tests should be repeated every 2 years.   - Beginning at age 63, you should have a Pap test every 3 years as long as the past 3 Pap tests have been normal.   - Some women have medical problems that increase the chance of getting cervical cancer. Talk to your health care provider about these problems. It is especially important to talk to your health care provider if a  new problem develops soon after your last Pap test. In these cases, your health care provider may recommend more frequent screening and Pap tests.   - The above recommendations are the same for women who have or have not gotten the vaccine for human papillomavirus (HPV).   - If you had a hysterectomy for a problem that was not cancer or a condition that could lead to cancer, then you no longer need Pap tests. Even if you no longer need a Pap test, a regular exam is a good idea to make sure no other problems are starting.   - If you are between ages 36 and 66 years, and you have had normal Pap tests going back 10 years, you no longer need Pap tests. Even if you no longer need a Pap test, a regular exam is a good idea to make sure no other problems are starting.   - If you have had past treatment for cervical cancer or a condition that could lead to cancer, you need Pap tests and screening for cancer for at least 20 years after your treatment.   - If Pap tests have been discontinued, risk factors (such as a new sexual partner) need to be reassessed to determine if screening should  be resumed.   - The HPV test is an additional test that may be used for cervical cancer screening. The HPV test looks for the virus that can cause the cell changes on the cervix. The cells collected during the Pap test can be tested for HPV. The HPV test could be used to screen women aged 70 years and older, and should be used in women of any age who have unclear Pap test results. After the age of 67, women should have HPV testing at the same frequency as a Pap test.   Colorectal cancer can be detected and often prevented. Most routine colorectal cancer screening begins at the age of 57 years and continues through age 26 years. However, your health care provider may recommend screening at an earlier age if you have risk factors for colon cancer. On a yearly basis, your health care provider may provide home test kits to check for hidden blood in the stool.  Use of a small camera at the end of a tube, to directly examine the colon (sigmoidoscopy or colonoscopy), can detect the earliest forms of colorectal cancer. Talk to your health care provider about this at age 23, when routine screening begins. Direct exam of the colon should be repeated every 5 -10 years through age 49 years, unless early forms of pre-cancerous polyps or small growths are found.   People who are at an increased risk for hepatitis B should be screened for this virus. You are considered at high risk for hepatitis B if:  -You were born in a country where hepatitis B occurs often. Talk with your health care provider about which countries are considered high risk.  - Your parents were born in a high-risk country and you have not received a shot to protect against hepatitis B (hepatitis B vaccine).  - You have HIV or AIDS.  - You use needles to inject street drugs.  - You live with, or have sex with, someone who has Hepatitis B.  - You get hemodialysis treatment.  - You take certain medicines for conditions like cancer, organ  transplantation, and autoimmune conditions.   Hepatitis C blood testing is recommended for all people born from [birth year] through 1965 and any individual  with known risks for hepatitis C.   Practice safe sex. Use condoms and avoid high-risk sexual practices to reduce the spread of sexually transmitted infections (STIs). STIs include gonorrhea, chlamydia, syphilis, trichomonas, herpes, HPV, and human immunodeficiency virus (HIV). Herpes, HIV, and HPV are viral illnesses that have no cure. They can result in disability, cancer, and death. Sexually active women aged 25 years and younger should be checked for chlamydia. Older women with new or multiple partners should also be tested for chlamydia. Testing for other STIs is recommended if you are sexually active and at increased risk.   Osteoporosis is a disease in which the bones lose minerals and strength with aging. This can result in serious bone fractures or breaks. The risk of osteoporosis can be identified using a bone density scan. Women ages 65 years and over and women at risk for fractures or osteoporosis should discuss screening with their health care providers. Ask your health care provider whether you should take a calcium supplement or vitamin D to There are also several preventive steps women can take to avoid osteoporosis and resulting fractures or to keep osteoporosis from worsening. -->Recommendations include:  Eat a balanced diet high in fruits, vegetables, calcium, and vitamins.  Get enough calcium. The recommended total intake of is 1,200 mg daily; for best absorption, if taking supplements, divide doses into 250-500 mg doses throughout the day. Of the two types of calcium, calcium carbonate is best absorbed when taken with food but calcium citrate can be taken on an empty stomach.  Get enough vitamin D. NAMS and the National Osteoporosis Foundation recommend at least 1,000 IU per day for women age 50 and over who are at risk of vitamin D  deficiency. Vitamin D deficiency can be caused by inadequate sun exposure (for example, those who live in northern latitudes).  Avoid alcohol and smoking. Heavy alcohol intake (more than 7 drinks per week) increases the risk of falls and hip fracture and women smokers tend to lose bone more rapidly and have lower bone mass than nonsmokers. Stopping smoking is one of the most important changes women can make to improve their health and decrease risk for disease.  Be physically active every day. Weight-bearing exercise (for example, fast walking, hiking, jogging, and weight training) may strengthen bones or slow the rate of bone loss that comes with aging. Balancing and muscle-strengthening exercises can reduce the risk of falling and fracture.  Consider therapeutic medications. Currently, several types of effective drugs are available. Healthcare providers can recommend the type most appropriate for each woman.  Eliminate environmental factors that may contribute to accidents. Falls cause nearly 90% of all osteoporotic fractures, so reducing this risk is an important bone-health strategy. Measures include ample lighting, removing obstructions to walking, using nonskid rugs on floors, and placing mats and/or grab bars in showers.  Be aware of medication side effects. Some common medicines make bones weaker. These include a type of steroid drug called glucocorticoids used for arthritis and asthma, some antiseizure drugs, certain sleeping pills, treatments for endometriosis, and some cancer drugs. An overactive thyroid gland or using too much thyroid hormone for an underactive thyroid can also be a problem. If you are taking these medicines, talk to your doctor about what you can do to help protect your bones.reduce the rate of osteoporosis.    Menopause can be associated with physical symptoms and risks. Hormone replacement therapy is available to decrease symptoms and risks. You should talk to your  health care provider   about whether hormone replacement therapy is right for you.   Use sunscreen. Apply sunscreen liberally and repeatedly throughout the day. You should seek shade when your shadow is shorter than you. Protect yourself by wearing long sleeves, pants, a wide-brimmed hat, and sunglasses year round, whenever you are outdoors.   Once a month, do a whole body skin exam, using a mirror to look at the skin on your back. Tell your health care provider of new moles, moles that have irregular borders, moles that are larger than a pencil eraser, or moles that have changed in shape or color.   -Stay current with required vaccines (immunizations).   Influenza vaccine. All adults should be immunized every year.  Tetanus, diphtheria, and acellular pertussis (Td, Tdap) vaccine. Pregnant women should receive 1 dose of Tdap vaccine during each pregnancy. The dose should be obtained regardless of the length of time since the last dose. Immunization is preferred during the 27th 36th week of gestation. An adult who has not previously received Tdap or who does not know her vaccine status should receive 1 dose of Tdap. This initial dose should be followed by tetanus and diphtheria toxoids (Td) booster doses every 10 years. Adults with an unknown or incomplete history of completing a 3-dose immunization series with Td-containing vaccines should begin or complete a primary immunization series including a Tdap dose. Adults should receive a Td booster every 10 years.  Varicella vaccine. An adult without evidence of immunity to varicella should receive 2 doses or a second dose if she has previously received 1 dose. Pregnant females who do not have evidence of immunity should receive the first dose after pregnancy. This first dose should be obtained before leaving the health care facility. The second dose should be obtained 4 8 weeks after the first dose.  Human papillomavirus (HPV) vaccine. Females aged 13 26  years who have not received the vaccine previously should obtain the 3-dose series. The vaccine is not recommended for use in pregnant females. However, pregnancy testing is not needed before receiving a dose. If a female is found to be pregnant after receiving a dose, no treatment is needed. In that case, the remaining doses should be delayed until after the pregnancy. Immunization is recommended for any person with an immunocompromised condition through the age of 26 years if she did not get any or all doses earlier. During the 3-dose series, the second dose should be obtained 4 8 weeks after the first dose. The third dose should be obtained 24 weeks after the first dose and 16 weeks after the second dose.  Zoster vaccine. One dose is recommended for adults aged 60 years or older unless certain conditions are present.  Measles, mumps, and rubella (MMR) vaccine. Adults born before 1957 generally are considered immune to measles and mumps. Adults born in 1957 or later should have 1 or more doses of MMR vaccine unless there is a contraindication to the vaccine or there is laboratory evidence of immunity to each of the three diseases. A routine second dose of MMR vaccine should be obtained at least 28 days after the first dose for students attending postsecondary schools, health care workers, or international travelers. People who received inactivated measles vaccine or an unknown type of measles vaccine during 1963 1967 should receive 2 doses of MMR vaccine. People who received inactivated mumps vaccine or an unknown type of mumps vaccine before 1979 and are at high risk for mumps infection should consider immunization with 2 doses of   MMR vaccine. For females of childbearing age, rubella immunity should be determined. If there is no evidence of immunity, females who are not pregnant should be vaccinated. If there is no evidence of immunity, females who are pregnant should delay immunization until after pregnancy.  Unvaccinated health care workers born before [birth year] who lack laboratory evidence of measles, mumps, or rubella immunity or laboratory confirmation of disease should consider measles and mumps immunization with 2 doses of MMR vaccine or rubella immunization with 1 dose of MMR vaccine.  Pneumococcal 13-valent conjugate (PCV13) vaccine. When indicated, a person who is uncertain of her immunization history and has no record of immunization should receive the PCV13 vaccine. An adult aged 54 years or older who has certain medical conditions and has not been previously immunized should receive 1 dose of PCV13 vaccine. This PCV13 should be followed with a dose of pneumococcal polysaccharide (PPSV23) vaccine. The PPSV23 vaccine dose should be obtained at least 8 weeks after the dose of PCV13 vaccine. An adult aged 58 years or older who has certain medical conditions and previously received 1 or more doses of PPSV23 vaccine should receive 1 dose of PCV13. The PCV13 vaccine dose should be obtained 1 or more years after the last PPSV23 vaccine dose.  Pneumococcal polysaccharide (PPSV23) vaccine. When PCV13 is also indicated, PCV13 should be obtained first. All adults aged 58 years and older should be immunized. An adult younger than age 65 years who has certain medical conditions should be immunized. Any person who resides in a nursing home or long-term care facility should be immunized. An adult smoker should be immunized. People with an immunocompromised condition and certain other conditions should receive both PCV13 and PPSV23 vaccines. People with human immunodeficiency virus (HIV) infection should be immunized as soon as possible after diagnosis. Immunization during chemotherapy or radiation therapy should be avoided. Routine use of PPSV23 vaccine is not recommended for American Indians, [place] Natives, or people younger than 65 years unless there are medical conditions that require PPSV23 vaccine. When indicated,  people who have unknown immunization and have no record of immunization should receive PPSV23 vaccine. One-time revaccination 5 years after the first dose of PPSV23 is recommended for people aged 70 64 years who have chronic kidney failure, nephrotic syndrome, asplenia, or immunocompromised conditions. People who received 1 2 doses of PPSV23 before age 32 years should receive another dose of PPSV23 vaccine at age 96 years or later if at least 5 years have passed since the previous dose. Doses of PPSV23 are not needed for people immunized with PPSV23 at or after age 55 years.  Meningococcal vaccine. Adults with asplenia or persistent complement component deficiencies should receive 2 doses of quadrivalent meningococcal conjugate (MenACWY-D) vaccine. The doses should be obtained at least 2 months apart. Microbiologists working with certain meningococcal bacteria, [employer] recruits, people at risk during an outbreak, and people who travel to or live in countries with a high rate of meningitis should be immunized. A first-year college student up through age 58 years who is living in a residence hall should receive a dose if she did not receive a dose on or after her 16th birthday. Adults who have certain high-risk conditions should receive one or more doses of vaccine.  Hepatitis A vaccine. Adults who wish to be protected from this disease, have certain high-risk conditions, work with hepatitis A-infected animals, work in hepatitis A research labs, or travel to or work in countries with a high rate of hepatitis A should be  immunized. Adults who were previously unvaccinated and who anticipate close contact with an international adoptee during the first 60 days after arrival in the [place] from a country with a high rate of hepatitis A should be immunized.  Hepatitis B vaccine.  Adults who wish to be protected from this disease, have certain high-risk conditions, may be exposed to blood or other infectious  body fluids, are household contacts or sex partners of hepatitis B positive people, are clients or workers in certain care facilities, or travel to or work in countries with a high rate of hepatitis B should be immunized.  Haemophilus influenzae type b (Hib) vaccine. A previously unvaccinated person with asplenia or sickle cell disease or having a scheduled splenectomy should receive 1 dose of Hib vaccine. Regardless of previous immunization, a recipient of a hematopoietic stem cell transplant should receive a 3-dose series 6 12 months after her successful transplant. Hib vaccine is not recommended for adults with HIV infection.  Preventive Services / Frequency Ages 6 to 39years  Blood pressure check.** / Every 1 to 2 years.  Lipid and cholesterol check.** / Every 5 years beginning at age 39.  Clinical breast exam.** / Every 3 years for women in their 61s and 62s.  BRCA-related cancer risk assessment.** / For women who have family members with a BRCA-related cancer (breast, ovarian, tubal, or peritoneal cancers).  Pap test.** / Every 2 years from ages 47 through 85. Every 3 years starting at age 34 through age 12 or 74 with a history of 3 consecutive normal Pap tests.  HPV screening.** / Every 3 years from ages 46 through ages 43 to 54 with a history of 3 consecutive normal Pap tests.  Hepatitis C blood test.** / For any individual with known risks for hepatitis C.  Skin self-exam. / Monthly.  Influenza vaccine. / Every year.  Tetanus, diphtheria, and acellular pertussis (Tdap, Td) vaccine.** / Consult your health care provider. Pregnant women should receive 1 dose of Tdap vaccine during each pregnancy. 1 dose of Td every 10 years.  Varicella vaccine.** / Consult your health care provider. Pregnant females who do not have evidence of immunity should receive the first dose after pregnancy.  HPV vaccine. / 3 doses over 6 months, if 64 and younger. The vaccine is not recommended for use in  pregnant females. However, pregnancy testing is not needed before receiving a dose.  Measles, mumps, rubella (MMR) vaccine.** / You need at least 1 dose of MMR if you were born in 1957 or later. You may also need a 2nd dose. For females of childbearing age, rubella immunity should be determined. If there is no evidence of immunity, females who are not pregnant should be vaccinated. If there is no evidence of immunity, females who are pregnant should delay immunization until after pregnancy.  Pneumococcal 13-valent conjugate (PCV13) vaccine.** / Consult your health care provider.  Pneumococcal polysaccharide (PPSV23) vaccine.** / 1 to 2 doses if you smoke cigarettes or if you have certain conditions.  Meningococcal vaccine.** / 1 dose if you are age 71 to 36 years and a Market researcher living in a residence hall, or have one of several medical conditions, you need to get vaccinated against meningococcal disease. You may also need additional booster doses.  Hepatitis A vaccine.** / Consult your health care provider.  Hepatitis B vaccine.** / Consult your health care provider.  Haemophilus influenzae type b (Hib) vaccine.** / Consult your health care provider.  Ages 55 to 64years  Blood pressure check.** / Every 1 to 2 years.  Lipid and cholesterol check.** / Every 5 years beginning at age 20 years.  Lung cancer screening. / Every year if you are aged 55 80 years and have a 30-pack-year history of smoking and currently smoke or have quit within the past 15 years. Yearly screening is stopped once you have quit smoking for at least 15 years or develop a health problem that would prevent you from having lung cancer treatment.  Clinical breast exam.** / Every year after age 40 years.  BRCA-related cancer risk assessment.** / For women who have family members with a BRCA-related cancer (breast, ovarian, tubal, or peritoneal cancers).  Mammogram.** / Every year beginning at age 40  years and continuing for as long as you are in good health. Consult with your health care provider.  Pap test.** / Every 3 years starting at age 30 years through age 65 or 70 years with a history of 3 consecutive normal Pap tests.  HPV screening.** / Every 3 years from ages 30 years through ages 65 to 70 years with a history of 3 consecutive normal Pap tests.  Fecal occult blood test (FOBT) of stool. / Every year beginning at age 50 years and continuing until age 75 years. You may not need to do this test if you get a colonoscopy every 10 years.  Flexible sigmoidoscopy or colonoscopy.** / Every 5 years for a flexible sigmoidoscopy or every 10 years for a colonoscopy beginning at age 50 years and continuing until age 75 years.  Hepatitis C blood test.** / For all people born from 1945 through 1965 and any individual with known risks for hepatitis C.  Skin self-exam. / Monthly.  Influenza vaccine. / Every year.  Tetanus, diphtheria, and acellular pertussis (Tdap/Td) vaccine.** / Consult your health care provider. Pregnant women should receive 1 dose of Tdap vaccine during each pregnancy. 1 dose of Td every 10 years.  Varicella vaccine.** / Consult your health care provider. Pregnant females who do not have evidence of immunity should receive the first dose after pregnancy.  Zoster vaccine.** / 1 dose for adults aged 60 years or older.  Measles, mumps, rubella (MMR) vaccine.** / You need at least 1 dose of MMR if you were born in 1957 or later. You may also need a 2nd dose. For females of childbearing age, rubella immunity should be determined. If there is no evidence of immunity, females who are not pregnant should be vaccinated. If there is no evidence of immunity, females who are pregnant should delay immunization until after pregnancy.  Pneumococcal 13-valent conjugate (PCV13) vaccine.** / Consult your health care provider.  Pneumococcal polysaccharide (PPSV23) vaccine.** / 1 to 2 doses if  you smoke cigarettes or if you have certain conditions.  Meningococcal vaccine.** / Consult your health care provider.  Hepatitis A vaccine.** / Consult your health care provider.  Hepatitis B vaccine.** / Consult your health care provider.  Haemophilus influenzae type b (Hib) vaccine.** / Consult your health care provider.  Ages 65 years and over  Blood pressure check.** / Every 1 to 2 years.  Lipid and cholesterol check.** / Every 5 years beginning at age 20 years.  Lung cancer screening. / Every year if you are aged 55 80 years and have a 30-pack-year history of smoking and currently smoke or have quit within the past 15 years. Yearly screening is stopped once you have quit smoking for at least 15 years or develop a health problem that   would prevent you from having lung cancer treatment.  Clinical breast exam.** / Every year after age 103 years.  BRCA-related cancer risk assessment.** / For women who have family members with a BRCA-related cancer (breast, ovarian, tubal, or peritoneal cancers).  Mammogram.** / Every year beginning at age 36 years and continuing for as long as you are in good health. Consult with your health care provider.  Pap test.** / Every 3 years starting at age 5 years through age 85 or 10 years with 3 consecutive normal Pap tests. Testing can be stopped between 65 and 70 years with 3 consecutive normal Pap tests and no abnormal Pap or HPV tests in the past 10 years.  HPV screening.** / Every 3 years from ages 93 years through ages 70 or 45 years with a history of 3 consecutive normal Pap tests. Testing can be stopped between 65 and 70 years with 3 consecutive normal Pap tests and no abnormal Pap or HPV tests in the past 10 years.  Fecal occult blood test (FOBT) of stool. / Every year beginning at age 8 years and continuing until age 45 years. You may not need to do this test if you get a colonoscopy every 10 years.  Flexible sigmoidoscopy or colonoscopy.** /  Every 5 years for a flexible sigmoidoscopy or every 10 years for a colonoscopy beginning at age 69 years and continuing until age 68 years.  Hepatitis C blood test.** / For all people born from [birth year] through 1966 and any individual with known risks for hepatitis C.  Osteoporosis screening.** / A one-time screening for women ages 7 years and over and women at risk for fractures or osteoporosis.  Skin self-exam. / Monthly.  Influenza vaccine. / Every year.  Tetanus, diphtheria, and acellular pertussis (Tdap/Td) vaccine.** / 1 dose of Td every 10 years.  Varicella vaccine.** / Consult your health care provider.  Zoster vaccine.** / 1 dose for adults aged 5 years or older.  Pneumococcal 13-valent conjugate (PCV13) vaccine.** / Consult your health care provider.  Pneumococcal polysaccharide (PPSV23) vaccine.** / 1 dose for all adults aged 74 years and older.  Meningococcal vaccine.** / Consult your health care provider.  Hepatitis A vaccine.** / Consult your health care provider.  Hepatitis B vaccine.** / Consult your health care provider.  Haemophilus influenzae type b (Hib) vaccine.** / Consult your health care provider. ** Family history and personal history of risk and conditions may change your health care provider's recommendations. Document Released: 06/01/2001 Document Revised: 01/24/2013  [facility] Patient Information 2014 [place], [place].   EXERCISE AND DIET:  We recommended that you start or continue a regular exercise program for good health. Regular exercise means any activity that makes your heart beat faster and makes you sweat.  We recommend exercising at least 30 minutes per day at least 3 days a week, preferably 5.  We also recommend a diet low in fat and sugar / carbohydrates.  Inactivity, poor dietary choices and obesity can cause diabetes, heart attack, stroke, and kidney damage, among others.     ALCOHOL AND SMOKING:  Women should limit their alcohol intake to no  more than 7 drinks/beers/glasses of wine (combined, not each!) per week. Moderation of alcohol intake to this level decreases your risk of breast cancer and liver damage.  ( And of course, no recreational drugs are part of a healthy lifestyle.)  Also, you should not be smoking at all or even being exposed to second hand smoke. Most people know smoking can  cause cancer, and various heart and lung diseases, but did you know it also contributes to weakening of your bones?  Aging of your skin?  Yellowing of your teeth and nails?   CALCIUM AND VITAMIN D:  Adequate intake of calcium and Vitamin D are recommended.  The recommendations for exact amounts of these supplements seem to change often, but generally speaking 600 mg of calcium (either carbonate or citrate) and 800 units of Vitamin D per day seems prudent. Certain women may benefit from higher intake of Vitamin D.  If you are among these women, your doctor will have told you during your visit.     PAP SMEARS:  Pap smears, to check for cervical cancer or precancers,  have traditionally been done yearly, although recent scientific advances have shown that most women can have pap smears less often.  However, every woman still should have a physical exam from her gynecologist or primary care physician every year. It will include a breast check, inspection of the vulva and vagina to check for abnormal growths or skin changes, a visual exam of the cervix, and then an exam to evaluate the size and shape of the uterus and ovaries.  And after 48 years of age, a rectal exam is indicated to check for rectal cancers. We will also provide age appropriate advice regarding health maintenance, like when you should have certain vaccines, screening for sexually transmitted diseases, bone density testing, colonoscopy, mammograms, etc.    MAMMOGRAMS:  All women over 65 years old should have a yearly mammogram. Many facilities now offer a "3D" mammogram, which may cost  around $50 extra out of pocket. If possible,  we recommend you accept the option to have the 3D mammogram performed.  It both reduces the number of women who will be called back for extra views which then turn out to be normal, and it is better than the routine mammogram at detecting truly abnormal areas.     COLONOSCOPY:  Colonoscopy to screen for colon cancer is recommended for all women at age 26.  We know, you hate the idea of the prep.  We agree, BUT, having colon cancer and not knowing it is worse!!  Colon cancer so often starts as a polyp that can be seen and removed at colonscopy, which can quite literally save your life!  And if your first colonoscopy is normal and you have no family history of colon cancer, most women don't have to have it again for 10 years.  Once every ten years, you can do something that may end up saving your life, right?  We will be happy to help you get it scheduled when you are ready.  Be sure to check your insurance coverage so you understand how much it will cost.  It may be covered as a preventative service at no cost, but you should check your particular policy.    Mediterranean Diet A Mediterranean diet refers to food and lifestyle choices that are based on the traditions of countries located on the [employer]. This way of eating has been shown to help prevent certain conditions and improve outcomes for people who have chronic diseases, like kidney disease and heart disease. What are tips for following this plan? Lifestyle  Cook and eat meals together with your family, when possible.  Drink enough fluid to keep your urine clear or pale yellow.  Be physically active every day. This includes: ? Aerobic exercise like running or swimming. ? Leisure activities like  gardening, walking, or housework.  Get 7-8 hours of sleep each night.  If recommended by your health care provider, drink red wine in moderation. This means 1 glass a day for nonpregnant women  and 2 glasses a day for men. A glass of wine equals 5 oz (150 mL). Reading food labels  Check the serving size of packaged foods. For foods such as rice and pasta, the serving size refers to the amount of cooked product, not dry.  Check the total fat in packaged foods. Avoid foods that have saturated fat or trans fats.  Check the ingredients list for added sugars, such as corn syrup. Shopping  At the grocery store, buy most of your food from the areas near the walls of the store. This includes: ? Fresh fruits and vegetables (produce). ? Grains, beans, nuts, and seeds. Some of these may be available in unpackaged forms or large amounts (in bulk). ? Fresh seafood. ? Poultry and eggs. ? Low-fat dairy products.  Buy whole ingredients instead of prepackaged foods.  Buy fresh fruits and vegetables in-season from local farmers markets.  Buy frozen fruits and vegetables in resealable bags.  If you do not have access to quality fresh seafood, buy precooked frozen shrimp or canned fish, such as tuna, salmon, or sardines.  Buy small amounts of raw or cooked vegetables, salads, or olives from the deli or salad bar at your store.  Stock your pantry so you always have certain foods on hand, such as olive oil, canned tuna, canned tomatoes, rice, pasta, and beans. Cooking  Cook foods with extra-virgin olive oil instead of using butter or other vegetable oils.  Have meat as a side dish, and have vegetables or grains as your main dish. This means having meat in small portions or adding small amounts of meat to foods like pasta or stew.  Use beans or vegetables instead of meat in common dishes like chili or lasagna.  Experiment with different cooking methods. Try roasting or broiling vegetables instead of steaming or sauteing them.  Add frozen vegetables to soups, stews, pasta, or rice.  Add nuts or seeds for added healthy fat at each meal. You can add these to yogurt, salads, or vegetable  dishes.  Marinate fish or vegetables using olive oil, lemon juice, garlic, and fresh herbs. Meal planning  Plan to eat 1 vegetarian meal one day each week. Try to work up to 2 vegetarian meals, if possible.  Eat seafood 2 or more times a week.  Have healthy snacks readily available, such as: ? Vegetable sticks with hummus. ? [place] yogurt. ? Fruit and nut trail mix.  Eat balanced meals throughout the week. This includes: ? Fruit: 2-3 servings a day ? Vegetables: 4-5 servings a day ? Low-fat dairy: 2 servings a day ? Fish, poultry, or lean meat: 1 serving a day ? Beans and legumes: 2 or more servings a week ? Nuts and seeds: 1-2 servings a day ? Whole grains: 6-8 servings a day ? Extra-virgin olive oil: 3-4 servings a day  Limit red meat and sweets to only a few servings a month What are my food choices?  Mediterranean diet ? Recommended ? Grains: Whole-grain pasta. Brown rice. Bulgar wheat. Polenta. Couscous. Whole-wheat bread. [place]. ? Vegetables: Artichokes. Beets. Broccoli. Cabbage. Carrots. Eggplant. Green beans. Chard. Kale. Spinach. Onions. Leeks. Peas. Squash. Tomatoes. Peppers. Radishes. ? Fruits: Apples. Apricots. Avocado. Berries. Bananas. Cherries. Dates. Figs. Grapes. Lemons. Melon. Oranges. Peaches. Plums. Pomegranate. ? Meats and other protein  foods: Beans. Almonds. Sunflower seeds. Pine nuts. Peanuts. [place]. Salmon. Scallops. Shrimp. [place]. Tilapia. Clams. Oysters. Eggs. ? Dairy: Low-fat milk. Cheese. Greek yogurt. ? Beverages: Water. Red wine. Herbal tea. ? Fats and oils: Extra virgin olive oil. Avocado oil. Grape seed oil. ? Sweets and desserts: [place] yogurt with honey. Baked apples. Poached pears. Trail mix. ? Seasoning and other foods: Basil. Cilantro. Coriander. Cumin. Mint. Parsley. Sage. Rosemary. Tarragon. Garlic. Oregano. Thyme. Pepper. Balsalmic vinegar. Tahini. Hummus. Tomato sauce. Olives. Mushrooms. ? Limit these ? Grains: Prepackaged pasta or  rice dishes. Prepackaged cereal with added sugar. ? Vegetables: Deep fried potatoes (french fries). ? Fruits: Fruit canned in syrup. ? Meats and other protein foods: Beef. Pork. Lamb. Poultry with skin. Hot dogs. [place] Salines. ? Dairy: Ice cream. Sour cream. Whole milk. ? Beverages: Juice. Sugar-sweetened soft drinks. Beer. Liquor and spirits. ? Fats and oils: Butter. Canola oil. Vegetable oil. Beef fat (tallow). Lard. ? Sweets and desserts: Cookies. Cakes. Pies. Candy. ? Seasoning and other foods: Mayonnaise. Premade sauces and marinades. ? The items listed may not be a complete list. Talk with your dietitian about what dietary choices are right for you. Summary  The Mediterranean diet includes both food and lifestyle choices.  Eat a variety of fresh fruits and vegetables, beans, nuts, seeds, and whole grains.  Limit the amount of red meat and sweets that you eat.  Talk with your health care provider about whether it is safe for you to drink red wine in moderation. This means 1 glass a day for nonpregnant women and 2 glasses a day for men. A glass of wine equals 5 oz (150 mL). This information is not intended to replace advice given to you by your health care provider. Make sure you discuss any questions you have with your health care provider. Document Released: 11/27/2015 Document Revised: 12/30/2015 Document Reviewed: 11/27/2015 Elsevier Interactive Patient Education  2018 [employer].  Restless Legs Syndrome Restless legs syndrome is a condition that causes uncomfortable feelings or sensations in the legs, especially while sitting or lying down. The sensations usually cause an overwhelming urge to move the legs. The arms can also sometimes be affected. The condition can range from mild to severe. The symptoms often interfere with a person's ability to sleep. What are the causes? The cause of this condition is not known. What increases the risk? This condition is more likely to develop  in:  People who are older than age 36.  Pregnant women. In general, restless legs syndrome is more common in women than in men.  People who have a family history of the condition.  People who have certain medical conditions, such as iron deficiency, kidney disease, Parkinson disease, or nerve damage.  People who take certain medicines, such as medicines for high blood pressure, nausea, colds, allergies, depression, and some heart conditions.  What are the signs or symptoms? The main symptom of this condition is uncomfortable sensations in the legs. These sensations may be:  Described as pulling, tingling, prickling, throbbing, crawling, or burning.  Worse while you are sitting or lying down.  Worse during periods of rest or inactivity.  Worse at night, often interfering with your sleep.  Accompanied by a very strong urge to move your legs.  Temporarily relieved by movement of your legs.  The sensations usually affect both sides of the body. The arms can also be affected, but this is rare. People who have this condition often have tiredness during the day because of their lack  of sleep at night. How is this diagnosed? This condition may be diagnosed based on your description of the symptoms. You may also have tests, including blood tests, to check for other conditions that may lead to your symptoms. In some cases, you may be asked to spend some time in a sleep lab so your sleeping can be monitored. How is this treated? Treatment for this condition is focused on managing the symptoms. Treatment may include:  Self-help and lifestyle changes.  Medicines.  Follow these instructions at home:  Take medicines only as directed by your health care provider.  Try these methods to get temporary relief from the uncomfortable sensations: ? Massage your legs. ? Walk or stretch. ? Take a cold or hot bath.  Practice good sleep habits. For example, go to bed and get up at the same time  every day.  Exercise regularly.  Practice ways of relaxing, such as yoga or meditation.  Avoid caffeine and alcohol.  Do not use any tobacco products, including cigarettes, chewing tobacco, or electronic cigarettes. If you need help quitting, ask your health care provider.  Keep all follow-up visits as directed by your health care provider. This is important. Contact a health care provider if: Your symptoms do not improve with treatment, or they get worse. This information is not intended to replace advice given to you by your health care provider. Make sure you discuss any questions you have with your health care provider. Document Released: 03/26/2002 Document Revised: 09/11/2015 Document Reviewed: 04/01/2014 Elsevier Interactive Patient Education  2018 [employer].  Continue to drink plenty of water and follow Mediterranean diet. Increase regular exercise, ie walking, biking, yoga, stretching. For Restless Leg Syndrome- one to two capsules of Gabapentin as needed at night. We will call you when lab results are available, Recommend annual follow-up, sooner if needed. NICE TO SEE YOU!

## 2018-01-16 NOTE — Assessment & Plan Note (Signed)
Continue to drink plenty of water and follow Mediterranean diet. Increase regular exercise, ie walking, biking, yoga, stretching. We will call you when lab results are available, Recommend annual follow-up, sooner if needed.

## 2018-01-17 LAB — CBC WITH DIFFERENTIAL/PLATELET
BASOS ABS: 0 10*3/uL (ref 0.0–0.2)
Basos: 0 %
EOS (ABSOLUTE): 0.1 10*3/uL (ref 0.0–0.4)
Eos: 1 %
HEMOGLOBIN: 12.7 g/dL (ref 11.1–15.9)
Hematocrit: 37.6 % (ref 34.0–46.6)
IMMATURE GRANS (ABS): 0 10*3/uL (ref 0.0–0.1)
Immature Granulocytes: 0 %
LYMPHS ABS: 1.1 10*3/uL (ref 0.7–3.1)
LYMPHS: 22 %
MCH: 32.5 pg (ref 26.6–33.0)
MCHC: 33.8 g/dL (ref 31.5–35.7)
MCV: 96 fL (ref 79–97)
MONOCYTES: 4 %
Monocytes Absolute: 0.2 10*3/uL (ref 0.1–0.9)
NEUTROS PCT: 73 %
Neutrophils Absolute: 3.5 10*3/uL (ref 1.4–7.0)
Platelets: 162 10*3/uL (ref 150–450)
RBC: 3.91 x10E6/uL (ref 3.77–5.28)
RDW: 13.3 % (ref 12.3–15.4)
WBC: 4.9 10*3/uL (ref 3.4–10.8)

## 2018-01-17 LAB — COMPREHENSIVE METABOLIC PANEL
ALBUMIN: 4.6 g/dL (ref 3.5–5.5)
ALK PHOS: 81 IU/L (ref 39–117)
ALT: 11 IU/L (ref 0–32)
AST: 15 IU/L (ref 0–40)
Albumin/Globulin Ratio: 2.4 — ABNORMAL HIGH (ref 1.2–2.2)
BUN / CREAT RATIO: 13 (ref 9–23)
BUN: 10 mg/dL (ref 6–24)
Bilirubin Total: 0.6 mg/dL (ref 0.0–1.2)
CO2: 24 mmol/L (ref 20–29)
CREATININE: 0.76 mg/dL (ref 0.57–1.00)
Calcium: 9.2 mg/dL (ref 8.7–10.2)
Chloride: 100 mmol/L (ref 96–106)
GFR calc Af Amer: 107 mL/min/{1.73_m2} (ref >59)
GFR calc non Af Amer: 93 mL/min/{1.73_m2} (ref >59)
GLUCOSE: 91 mg/dL (ref 65–99)
Globulin, Total: 1.9 g/dL (ref 1.5–4.5)
Potassium: 4 mmol/L (ref 3.5–5.2)
Sodium: 140 mmol/L (ref 134–144)
Total Protein: 6.5 g/dL (ref 6.0–8.5)

## 2018-01-17 LAB — LIPID PANEL
CHOL/HDL RATIO: 2.6 ratio (ref 0.0–4.4)
Cholesterol, Total: 164 mg/dL (ref 100–199)
HDL: 63 mg/dL (ref >39)
LDL CALC: 95 mg/dL (ref 0–99)
TRIGLYCERIDES: 30 mg/dL (ref 0–149)
VLDL Cholesterol Cal: 6 mg/dL (ref 5–40)

## 2018-01-17 LAB — HEMOGLOBIN A1C
Est. average glucose Bld gHb Est-mCnc: 111 mg/dL
HEMOGLOBIN A1C: 5.5 % (ref 4.8–5.6)

## 2018-01-17 LAB — TSH: TSH: 4.49 u[IU]/mL (ref 0.450–4.500)

## 2018-01-23 ENCOUNTER — Ambulatory Visit
Admission: RE | Admit: 2018-01-23 | Discharge: 2018-01-23 | Disposition: A | Discharge status: Home / Self Care | Payer: BLUE CROSS/BLUE SHIELD | Source: Ambulatory Visit | Attending: Adult Health | Admitting: Adult Health

## 2018-01-23 DIAGNOSIS — Z1231 Encounter for screening mammogram for malignant neoplasm of breast: Secondary | ICD-10-CM | POA: Diagnosis not present

## 2018-05-29 NOTE — Progress Notes (Signed)
Subjective:    Patient ID: [name], female    DOB: 03-Feb-1971, 48 y.o.   MRN: [id number]  HPI:  [name] presents with two issues- 1) Chronic thoracic back pain >25 years She denies acute injury/accident prior to onset of sx's Pain will be intermittent, described between "dull ache to burning sensation", rated 7/10 and located primarily over thoracic back, however "sometimes I will get pain over sides of my back too". She reports stretching each morning and using heating pad frequently. She reports standing for long periods, ie cooking for >20 mins will worsen pain Rest, heat, and OTC NSAIDs will decrease pain She has never been evaluated by PT or Ortho She denies change in bowel/bladder habits She denies lower extremity paresthesia  2) Intermittent epigastric pain that she states "I think will occur after I eat".  She is unsure if it is worse with fatty meals.  She denies N/V/D She reports several family members have had cholecystectomy She denies hematochezia Her mother was dx'd with colon ca in her mid 5550s and made a full remission  Patient Care Team    Relationship Specialty Notifications Start End  William Hamburgeranford,  D, NP PCP - General Family Medicine  12/15/16   [facility]    12/15/16     Patient Active Problem List   Diagnosis Date Noted  . Chronic bilateral thoracic back pain 05/30/2018  . Epigastric pain 05/30/2018  . Restless legs syndrome (RLS) 01/16/2018  . TMJ (temporomandibular joint syndrome) 08/24/2017  . Healthcare maintenance 12/15/2016  . PAC (premature atrial contraction) 07/12/2011  . DIZZINESS 12/03/2008  . TIREDNESS 12/03/2008  . PALPITATIONS 12/03/2008     Past Medical History:  Diagnosis Date  . Acute appendicitis 2012     Past Surgical History:  Procedure Laterality Date  . APPENDECTOMY    . CESAREAN SECTION  1992, 2005  . LAPAROSCOPIC APPENDECTOMY  2012     Family History  Problem Relation Age of Onset  . Cancer  Sister        Breast and brain  . Breast cancer Sister [id number]  . Cancer Mother        colon  . Hypertension Mother   . Hyperlipidemia Father   . Hypertension Father   . Hypothyroidism Father   . Healthy Son   . Alcohol abuse Maternal Uncle   . Cancer Maternal Grandmother        lung  . Hypothyroidism Sister   . Cancer Sister        uterine  . Healthy Son      Social History   Substance and Sexual Activity  Drug Use No     Social History   Substance and Sexual Activity  Alcohol Use No     Social History   Tobacco Use  Smoking Status Never Smoker  Smokeless Tobacco Never Used     Outpatient Encounter Medications as of 05/30/2018  Medication Sig  . Cholecalciferol (VITAMIN D3) 2000 units TABS Take 1 capsule by mouth daily.  Marland Kitchen. gabapentin (NEURONTIN) 100 MG capsule 1-2 capsules at bedtime as needed for restless leg syndrome  . ibuprofen (ADVIL,MOTRIN) 200 MG tablet Take 200 mg by mouth as needed.  Marland Kitchen. levonorgestrel (MIRENA) 20 MCG/24HR IUD 1 each by Intrauterine route once.  . Multiple Vitamin (MULTIVITAMIN) tablet Take 1 tablet by mouth daily.  . Multiple Vitamins-Minerals (ADULT ONE DAILY GUMMIES PO) Take 1 each by mouth daily.  Bertram Gala. Polyethyl Glycol-Propyl Glycol (SYSTANE OP) Apply 2 drops  to eye as needed.  . vitamin B-12 (CYANOCOBALAMIN) 1000 MCG tablet Take 1,000 mcg by mouth daily.  . cyclobenzaprine (FLEXERIL) 10 MG tablet Take 1 tablet (10 mg total) by mouth at bedtime.   No facility-administered encounter medications on file as of 05/30/2018.     Allergies: Patient has no known allergies.  Body mass index is 24.97 kg/m.  Blood pressure (!) 143/88, pulse 74, temperature 98.5 F (36.9 C), temperature source Oral, height 5' 7.5" (1.715 m), weight 161 lb 12.8 oz (73.4 kg), SpO2 100 %.  Review of Systems  Constitutional: Positive for fatigue. Negative for activity change, appetite change, chills, diaphoresis, fever and unexpected weight change.  Respiratory:  Negative for cough, chest tightness, shortness of breath, wheezing and stridor.   Cardiovascular: Negative for chest pain, palpitations and leg swelling.  Gastrointestinal: Positive for abdominal distention and abdominal pain. Negative for blood in stool, constipation, diarrhea and nausea.  Genitourinary: Negative for difficulty urinating and dysuria.  Musculoskeletal: Positive for arthralgias, back pain and myalgias.  Neurological: Negative for dizziness and headaches.  Hematological: Does not bruise/bleed easily.       Objective:   Physical Exam Vitals signs and nursing note reviewed.  Constitutional:      General: She is not in acute distress.    Appearance: She is normal weight. She is not ill-appearing, toxic-appearing or diaphoretic.  Cardiovascular:     Rate and Rhythm: Normal rate.     Pulses: Normal pulses.     Heart sounds: Normal heart sounds. No murmur. No friction rub. No gallop.   Pulmonary:     Effort: Pulmonary effort is normal. No respiratory distress.     Breath sounds: Normal breath sounds. No stridor. No wheezing, rhonchi or rales.  Chest:     Chest wall: No tenderness.  Abdominal:     General: Bowel sounds are normal. There is no distension.     Palpations: Abdomen is soft. There is no mass.     Tenderness: There is no abdominal tenderness. There is no right CVA tenderness, left CVA tenderness, guarding or rebound. Negative signs include Murphy's sign, Rovsing's sign, McBurney's sign, psoas sign and obturator sign.     Hernia: No hernia is present.  Musculoskeletal: Normal range of motion.        General: No swelling, tenderness, deformity or signs of injury.     Cervical back: She exhibits spasm. She exhibits normal range of motion and no tenderness.     Thoracic back: She exhibits normal range of motion and no tenderness.     Lumbar back: She exhibits normal range of motion and no tenderness.     Right lower leg: No edema.     Left lower leg: No edema.      Comments: Excellent lumbar ROM- clearly has been stretching daily   Skin:    General: Skin is warm.     Capillary Refill: Capillary refill takes less than 2 seconds.  Neurological:     Mental Status: She is alert and oriented to person, place, and time.  Psychiatric:        Mood and Affect: Mood normal.        Behavior: Behavior normal.        Thought Content: Thought content normal.        Judgment: Judgment normal.       Assessment & Plan:   1. Chronic bilateral thoracic back pain   2. Epigastric pain     Chronic bilateral thoracic back  pain Continue to stretch daily. Referral to Physical Therapy to address chronic back pain. Cyclobenzaprine QHS PRN  Epigastric pain Several family members have had cholecystectomy. Increase water and follow "Gallbladder Eating Plan". Please keep "Food Journal" and note what types of food trigger abdominal pain. Please call clinic with any questions/concerns.    FOLLOW-UP:  Return if symptoms worsen or fail to improve.

## 2018-05-30 ENCOUNTER — Encounter: Payer: Self-pay | Admitting: Adult Health

## 2018-05-30 ENCOUNTER — Ambulatory Visit (INDEPENDENT_AMBULATORY_CARE_PROVIDER_SITE_OTHER): Payer: BLUE CROSS/BLUE SHIELD | Admitting: Adult Health

## 2018-05-30 VITALS — BP 143/88 | HR 74 | Temp 98.5°F | Ht 67.5 in | Wt 161.8 lb

## 2018-05-30 DIAGNOSIS — R1013 Epigastric pain: Secondary | ICD-10-CM | POA: Insufficient documentation

## 2018-05-30 DIAGNOSIS — M546 Pain in thoracic spine: Secondary | ICD-10-CM

## 2018-05-30 DIAGNOSIS — G8929 Other chronic pain: Secondary | ICD-10-CM

## 2018-05-30 MED ORDER — CYCLOBENZAPRINE HCL 10 MG PO TABS: 10.0000 mg | ORAL_TABLET | Freq: Every day | ORAL | 0 refills | Status: DC

## 2018-05-30 NOTE — Patient Instructions (Signed)
Acute Back Pain, Adult Acute back pain is sudden and usually short-lived. It is often caused by an injury to the muscles and tissues in the back. The injury may result from:  A muscle or ligament getting overstretched or torn (strained). Ligaments are tissues that connect bones to each other. Lifting something improperly can cause a back strain.  Wear and tear (degeneration) of the spinal disks. Spinal disks are circular tissue that provides cushioning between the bones of the spine (vertebrae).  Twisting motions, such as while playing sports or doing yard work.  A hit to the back.  Arthritis. You may have a physical exam, lab tests, and imaging tests to find the cause of your pain. Acute back pain usually goes away with rest and home care. Follow these instructions at home: Managing pain, stiffness, and swelling  Take over-the-counter and prescription medicines only as told by your health care provider.  Your health care provider may recommend applying ice during the first 24-48 hours after your pain starts. To do this: ? Put ice in a plastic bag. ? Place a towel between your skin and the bag. ? Leave the ice on for 20 minutes, 2-3 times a day.  If directed, apply heat to the affected area as often as told by your health care provider. Use the heat source that your health care provider recommends, such as a moist heat pack or a heating pad. ? Place a towel between your skin and the heat source. ? Leave the heat on for 20-30 minutes. ? Remove the heat if your skin turns bright red. This is especially important if you are unable to feel pain, heat, or cold. You have a greater risk of getting burned. Activity   Do not stay in bed. Staying in bed for more than 1-2 days can delay your recovery.  Sit up and stand up straight. Avoid leaning forward when you sit, or hunching over when you stand. ? If you work at a desk, sit close to it so you do not need to lean over. Keep your chin tucked  in. Keep your neck drawn back, and keep your elbows bent at a right angle. Your arms should look like the letter "L." ? Sit high and close to the steering wheel when you drive. Add lower back (lumbar) support to your car seat, if needed.  Take short walks on even surfaces as soon as you are able. Try to increase the length of time you walk each day.  Do not sit, drive, or stand in one place for more than 30 minutes at a time. Sitting or standing for long periods of time can put stress on your back.  Do not drive or use heavy machinery while taking prescription pain medicine.  Use proper lifting techniques. When you bend and lift, use positions that put less stress on your back: ? Bend your knees. ? Keep the load close to your body. ? Avoid twisting.  Exercise regularly as told by your health care provider. Exercising helps your back heal faster and helps prevent back injuries by keeping muscles strong and flexible.  Work with a physical therapist to make a safe exercise program, as recommended by your health care provider. Do any exercises as told by your physical therapist. Lifestyle  Maintain a healthy weight. Extra weight puts stress on your back and makes it difficult to have good posture.  Avoid activities or situations that make you feel anxious or stressed. Stress and anxiety increase muscle   tension and can make back pain worse. Learn ways to manage anxiety and stress, such as through exercise. General instructions  Sleep on a firm mattress in a comfortable position. Try lying on your side with your knees slightly bent. If you lie on your back, put a pillow under your knees.  Follow your treatment plan as told by your health care provider. This may include: ? Cognitive or behavioral therapy. ? Acupuncture or massage therapy. ? Meditation or yoga. Contact a health care provider if:  You have pain that is not relieved with rest or medicine.  You have increasing pain going down  into your legs or buttocks.  Your pain does not improve after 2 weeks.  You have pain at night.  You lose weight without trying.  You have a fever or chills. Get help right away if:  You develop new bowel or bladder control problems.  You have unusual weakness or numbness in your arms or legs.  You develop nausea or vomiting.  You develop abdominal pain.  You feel faint. Summary  Acute back pain is sudden and usually short-lived.  Use proper lifting techniques. When you bend and lift, use positions that put less stress on your back.  Take over-the-counter and prescription medicines and apply heat or ice as directed by your health care provider. This information is not intended to replace advice given to you by your health care provider. Make sure you discuss any questions you have with your health care provider. Document Released: 04/05/2005 Document Revised: 11/10/2017 Document Reviewed: 11/17/2016 Elsevier Interactive Patient Education  2019 [employer]  Gallbladder Eating Plan If you have a gallbladder condition, you may have trouble digesting fats. Eating a low-fat diet can help reduce your symptoms, and may be helpful before and after having surgery to remove your gallbladder (cholecystectomy). Your health care provider may recommend that you work with a diet and nutrition specialist (dietitian) to help you reduce the amount of fat in your diet. What are tips for following this plan? General guidelines  Limit your fat intake to less than 30% of your total daily calories. If you eat around 1,800 calories each day, this is less than 60 grams (g) of fat per day.  Fat is an important part of a healthy diet. Eating a low-fat diet can make it hard to maintain a healthy body weight. Ask your dietitian how much fat, calories, and other nutrients you need each day.  Eat small, frequent meals throughout the day instead of three large meals.  Drink at least 8-10 cups of fluid a  day. Drink enough fluid to keep your urine clear or pale yellow.  Limit alcohol intake to no more than 1 drink a day for nonpregnant women and 2 drinks a day for men. One drink equals 12 oz of beer, 5 oz of wine, or 1 oz of hard liquor. Reading food labels  Check Nutrition Facts on food labels for the amount of fat per serving. Choose foods with less than 3 grams of fat per serving. Shopping  Choose nonfat and low-fat healthy foods. Look for the words "nonfat," "low fat," or "fat free."  Avoid buying processed or prepackaged foods. Cooking  Cook using low-fat methods, such as baking, broiling, grilling, or boiling.  Cook with small amounts of healthy fats, such as olive oil, grapeseed oil, canola oil, or sunflower oil. What foods are recommended?   All fresh, frozen, or canned fruits and vegetables.  Whole grains.  Low-fat or non-fat (  skim) milk and yogurt.  Lean meat, skinless poultry, fish, eggs, and beans.  Low-fat protein supplement powders or drinks.  Spices and herbs. What foods are not recommended?  High-fat foods. These include baked goods, fast food, fatty cuts of meat, ice cream, french toast, sweet rolls, pizza, cheese bread, foods covered with butter, creamy sauces, or cheese.  Fried foods. These include french fries, tempura, battered fish, breaded chicken, fried breads, and sweets.  Foods with strong odors.  Foods that cause bloating and gas. Summary  A low-fat diet can be helpful if you have a gallbladder condition, or before and after gallbladder surgery.  Limit your fat intake to less than 30% of your total daily calories. This is about 60 g of fat if you eat 1,800 calories each day.  Eat small, frequent meals throughout the day instead of three large meals. This information is not intended to replace advice given to you by your health care provider. Make sure you discuss any questions you have with your health care provider. Document Released:  04/10/2013 Document Revised: 05/13/2016 Document Reviewed: 05/13/2016 Elsevier Interactive Patient Education  2019 [employer].   Continue to stretch daily. Referral to Physical Therapy to address chronic back pain. Increase water and follow "Gallbladder Eating Plan". Please keep "Food Journal" and note what types of food trigger abdominal pain. Please call clinic with any questions/concerns. FEEL BETTER!

## 2018-05-30 NOTE — Assessment & Plan Note (Signed)
Continue to stretch daily. Referral to Physical Therapy to address chronic back pain. Cyclobenzaprine QHS PRN

## 2018-05-30 NOTE — Assessment & Plan Note (Signed)
Several family members have had cholecystectomy. Increase water and follow "Gallbladder Eating Plan". Please keep "Food Journal" and note what types of food trigger abdominal pain. Please call clinic with any questions/concerns.

## 2018-06-12 ENCOUNTER — Ambulatory Visit: Payer: BLUE CROSS/BLUE SHIELD | Attending: Adult Health | Admitting: Physical Therapy

## 2018-06-28 DIAGNOSIS — Z6824 Body mass index (BMI) 24.0-24.9, adult: Secondary | ICD-10-CM | POA: Diagnosis not present

## 2018-06-28 DIAGNOSIS — Z01419 Encounter for gynecological examination (general) (routine) without abnormal findings: Secondary | ICD-10-CM | POA: Diagnosis not present

## 2018-11-24 DIAGNOSIS — M25552 Pain in left hip: Secondary | ICD-10-CM | POA: Diagnosis not present

## 2018-11-24 DIAGNOSIS — M25551 Pain in right hip: Secondary | ICD-10-CM | POA: Diagnosis not present

## 2018-11-24 DIAGNOSIS — M545 Low back pain: Secondary | ICD-10-CM | POA: Diagnosis not present

## 2018-11-27 DIAGNOSIS — M25552 Pain in left hip: Secondary | ICD-10-CM | POA: Diagnosis not present

## 2018-11-27 DIAGNOSIS — M25551 Pain in right hip: Secondary | ICD-10-CM | POA: Diagnosis not present

## 2018-11-27 DIAGNOSIS — M545 Low back pain: Secondary | ICD-10-CM | POA: Diagnosis not present

## 2018-11-28 DIAGNOSIS — M545 Low back pain: Secondary | ICD-10-CM | POA: Diagnosis not present

## 2018-11-28 DIAGNOSIS — S39012D Strain of muscle, fascia and tendon of lower back, subsequent encounter: Secondary | ICD-10-CM | POA: Diagnosis not present

## 2018-11-28 DIAGNOSIS — M5416 Radiculopathy, lumbar region: Secondary | ICD-10-CM | POA: Diagnosis not present

## 2018-11-30 ENCOUNTER — Telehealth: Payer: Self-pay | Admitting: Adult Health

## 2018-11-30 NOTE — Telephone Encounter (Signed)
Patient called this morning to sch her yearly CPE for Oct. At the end of the conversation she said she has an issue that she would like to discuss with clinic staff but did not want to divulge to me the details. She is requesting a call back on her cell [phone]).

## 2018-11-30 NOTE — Telephone Encounter (Signed)
Pt states that she has been seeing ortho for back pain and that they have ordered an MRI.  Pt wanted to know if Mina Marble, NP thought that this is a good idea.  Advised pt that Valetta Fuller defers these decisions to the specialist and if the specialist believes that this test needs to be done, then she would be in agreement.  Pt expressed understanding.  Charyl Bigger, CMA

## 2018-12-01 DIAGNOSIS — Z6825 Body mass index (BMI) 25.0-25.9, adult: Secondary | ICD-10-CM | POA: Diagnosis not present

## 2018-12-01 DIAGNOSIS — R102 Pelvic and perineal pain: Secondary | ICD-10-CM | POA: Diagnosis not present

## 2018-12-01 DIAGNOSIS — R1031 Right lower quadrant pain: Secondary | ICD-10-CM | POA: Diagnosis not present

## 2018-12-06 DIAGNOSIS — M545 Low back pain: Secondary | ICD-10-CM | POA: Diagnosis not present

## 2018-12-14 DIAGNOSIS — M545 Low back pain: Secondary | ICD-10-CM | POA: Diagnosis not present

## 2019-01-25 ENCOUNTER — Encounter: Payer: BLUE CROSS/BLUE SHIELD | Admitting: Adult Health

## 2019-02-06 NOTE — Progress Notes (Addendum)
Subjective:    Patient ID: [name], female    DOB: Mar 25, 1971, 48 y.o.   MRN: [id number]  HPI:  12/15/16 OV:  [name] is here to establish as a new pt.  She is a very healthy, pleasant 48 year old female. PMH: Intermittent lumbar back pain that will occur with prolonged standing/walking.  She denies previous trauma/injury.  She drinks >80 ounces water/day and eats a heart healthy diet.  She is married and had two sons ages 70 and 61.  She works FT as Social research officer, government for [employer].  She has sig family hx of cancer: Sister dx'd with breast ca at age 49 and she passed away at age 67.   Her mother dx'd with colon ca at age 84-in full remission. She denies tobacco/EOTH use.  01/16/18 OV: [name] presents for CPE She continues to drink >80 ounces water/day and follows heart healthy diet She enjoys walking several times/week She continues to abstain and from tobacco/ETOH  She has one acute complaint- increased "moving legs at night that wakes my several times a night". She reports RLS sx's progressively worsening >3 years and estimates sx's to occur 2-3 nights per week and cause her to typically wake 0100, 0300, 0430 which will result in sig fatigue the following day. She has never been on Gabapentin for sx management Fasting labs obtained today  Healthcare Maintenance: PAP-UTD, Jan 2019, scanned in chart Mammogram-ordered  Immunizations- declined Tdap today  02/07/2019 OV: [name] is here for CPE She continues to drink >80 ounces water/day and continues to consume heart healthy diet She enjoys walking several times/week- doesn't measure distance or time, prefers to stroll. She also plays with her 2 yr grandchild on weekends. She continues to abstain and from tobacco/ETOH. She has annual optometry visits each fall and OB/GYN each winter.  She reports mother was dx'd at age 73 with colon ca. She denies abdominal pain or hematochezia- but would like to have  colonoscopy sooner due to first degree family hx.  Healthcare Maintenance: PAP-UTD, Jan 2019, scanned in chart Mammogram-Ordered, pt will schedule Colonoscopy-ordered- due to family hx Immunizations-Declined Tdap, Influenza vaccinations  Patient Care Team    Relationship Specialty Notifications Start End  Julaine Fusi, NP PCP - General Family Medicine  12/15/16   Ob/Gyn, Nestor Ramp    12/15/16     Patient Active Problem List   Diagnosis Date Noted  . Chronic bilateral thoracic back pain 05/30/2018  . Epigastric pain 05/30/2018  . Restless legs syndrome (RLS) 01/16/2018  . TMJ (temporomandibular joint syndrome) 08/24/2017  . Healthcare maintenance 12/15/2016  . PAC (premature atrial contraction) 07/12/2011  . DIZZINESS 12/03/2008  . TIREDNESS 12/03/2008  . PALPITATIONS 12/03/2008     Past Medical History:  Diagnosis Date  . Acute appendicitis 2012     Past Surgical History:  Procedure Laterality Date  . APPENDECTOMY    . CESAREAN SECTION  1992, 2005  . LAPAROSCOPIC APPENDECTOMY  2012     Family History  Problem Relation Age of Onset  . Cancer Sister        Breast and brain  . Breast cancer Sister 46  . Cancer Mother        colon  . Hypertension Mother   . Hyperlipidemia Father   . Hypertension Father   . Hypothyroidism Father   . Healthy Son   . Alcohol abuse Maternal Uncle   . Cancer Maternal Grandmother        lung  .  Hypothyroidism Sister   . Cancer Sister        uterine  . Healthy Son      Social History   Substance and Sexual Activity  Drug Use No     Social History   Substance and Sexual Activity  Alcohol Use No     Social History   Tobacco Use  Smoking Status Never Smoker  Smokeless Tobacco Never Used     Outpatient Encounter Medications as of 02/07/2019  Medication Sig  . Cholecalciferol (VITAMIN D3) 2000 units TABS Take 1 capsule by mouth daily.  . cyclobenzaprine (FLEXERIL) 10 MG tablet Take 1 tablet (10 mg total) by  mouth at bedtime.  . gabapentin (NEURONTIN) 100 MG capsule 1-2 capsules at bedtime as needed for restless leg syndrome  . ibuprofen (ADVIL,MOTRIN) 200 MG tablet Take 200 mg by mouth as needed.  Marland Kitchen levonorgestrel (MIRENA) 20 MCG/24HR IUD 1 each by Intrauterine route once.  . Multiple Vitamin (MULTIVITAMIN) tablet Take 1 tablet by mouth daily.  . Multiple Vitamins-Minerals (ADULT ONE DAILY GUMMIES PO) Take 1 each by mouth daily.  Vladimir Faster Glycol-Propyl Glycol (SYSTANE OP) Apply 2 drops to eye as needed.  . vitamin B-12 (CYANOCOBALAMIN) 1000 MCG tablet Take 1,000 mcg by mouth daily.   No facility-administered encounter medications on file as of 02/07/2019.     Allergies: Patient has no known allergies.  Body mass index is 25.4 kg/m.  Blood pressure (!) 102/59, pulse 66, temperature 98.7 F (37.1 C), temperature source Oral, height 5' 7.5" (1.715 m), weight 164 lb 9.6 oz (74.7 kg), SpO2 100 %.  Review of Systems  Constitutional: Positive for fatigue. Negative for activity change, appetite change, chills, diaphoresis, fever and unexpected weight change.  HENT: Negative for congestion.   Eyes: Negative for visual disturbance.  Respiratory: Negative for cough, chest tightness, shortness of breath, wheezing and stridor.   Cardiovascular: Negative for chest pain, palpitations and leg swelling.  Gastrointestinal: Negative for abdominal distention, anal bleeding, blood in stool, constipation, diarrhea, nausea and vomiting.  Endocrine: Negative for polydipsia, polyphagia and polyuria.  Genitourinary: Negative for difficulty urinating and flank pain.  Musculoskeletal: Negative for arthralgias, back pain, gait problem, joint swelling, myalgias, neck pain and neck stiffness.  Skin: Negative for color change, pallor, rash and wound.  Neurological: Negative for dizziness and headaches.  Hematological: Negative for adenopathy. Does not bruise/bleed easily.  Psychiatric/Behavioral: Negative for  agitation, behavioral problems, confusion, decreased concentration, dysphoric mood, hallucinations, self-injury, sleep disturbance and suicidal ideas. The patient is not nervous/anxious and is not hyperactive.        Objective:   Physical Exam Vitals signs and nursing note reviewed. Exam conducted with a chaperone present.  Constitutional:      General: She is not in acute distress.    Appearance: She is normal weight. She is not ill-appearing, toxic-appearing or diaphoretic.  HENT:     Head: Normocephalic and atraumatic.     Right Ear: Tympanic membrane, ear canal and external ear normal. There is no impacted cerumen.     Left Ear: Tympanic membrane, ear canal and external ear normal. There is no impacted cerumen.     Nose: Nose normal. No congestion.     Mouth/Throat:     Mouth: Mucous membranes are moist.     Pharynx: No oropharyngeal exudate.  Eyes:     Extraocular Movements: Extraocular movements intact.     Conjunctiva/sclera: Conjunctivae normal.     Pupils: Pupils are equal, round, and reactive to light.  Neck:     Musculoskeletal: Normal range of motion and neck supple.  Cardiovascular:     Rate and Rhythm: Normal rate and regular rhythm.     Pulses: Normal pulses.     Heart sounds: Normal heart sounds. No murmur. No friction rub. No gallop.   Pulmonary:     Effort: Pulmonary effort is normal. No respiratory distress.     Breath sounds: Normal breath sounds. No stridor. No wheezing, rhonchi or rales.  Chest:     Chest wall: No tenderness.  Abdominal:     General: Abdomen is flat. Bowel sounds are normal. There is no distension.     Palpations: Abdomen is soft. There is no mass.     Tenderness: There is no abdominal tenderness. There is no right CVA tenderness, left CVA tenderness, guarding or rebound.     Hernia: No hernia is present.  Musculoskeletal: Normal range of motion.        General: No tenderness.  Skin:    General: Skin is warm and dry.     Capillary  Refill: Capillary refill takes less than 2 seconds.  Neurological:     Mental Status: She is alert and oriented to person, place, and time.     Coordination: Coordination normal.  Psychiatric:        Mood and Affect: Mood normal.        Behavior: Behavior normal.        Thought Content: Thought content normal.        Judgment: Judgment normal.       Assessment & Plan:   1. Vitamin D deficiency   2. Hepatitis A and hepatitis B vaccine administered   3. Healthcare maintenance   4. Encounter for screening mammogram for malignant neoplasm of breast   5. Need for Tdap vaccination   6. Palpitations   7. Restless legs syndrome (RLS)     Healthcare maintenance Remain well hydrated.   Follow Heart Healthy diet. Increase regular exercise.  Recommend at least 30 minutes daily, 5 days per week of walking, biking, swimming, YouTube/Pinterest workout videos. We will call you with lab results. Continue with annual OB/GYN visits. Continue to social distance and wear a mask when in public. Annual physical with fasting labs here  PALPITATIONS None in last 12 months  Restless legs syndrome (RLS) Well controlled with Gabapentin 100mg  2 capsules PRN QHS    FOLLOW-UP:  Return in about 1 year (around 02/07/2020) for CPE, Fasting Labs.

## 2019-02-07 ENCOUNTER — Encounter: Payer: Self-pay | Admitting: Adult Health

## 2019-02-07 ENCOUNTER — Other Ambulatory Visit: Payer: Self-pay | Admitting: Adult Health

## 2019-02-07 ENCOUNTER — Other Ambulatory Visit: Payer: Self-pay

## 2019-02-07 ENCOUNTER — Ambulatory Visit (INDEPENDENT_AMBULATORY_CARE_PROVIDER_SITE_OTHER): Payer: BC Managed Care – PPO | Admitting: Adult Health

## 2019-02-07 VITALS — BP 102/59 | HR 66 | Temp 98.7°F | Ht 67.5 in | Wt 164.6 lb

## 2019-02-07 DIAGNOSIS — E7800 Pure hypercholesterolemia, unspecified: Secondary | ICD-10-CM | POA: Diagnosis not present

## 2019-02-07 DIAGNOSIS — Z9229 Personal history of other drug therapy: Secondary | ICD-10-CM | POA: Diagnosis not present

## 2019-02-07 DIAGNOSIS — Z800 Family history of malignant neoplasm of digestive organs: Secondary | ICD-10-CM

## 2019-02-07 DIAGNOSIS — R002 Palpitations: Secondary | ICD-10-CM

## 2019-02-07 DIAGNOSIS — Z23 Encounter for immunization: Secondary | ICD-10-CM

## 2019-02-07 DIAGNOSIS — Z1231 Encounter for screening mammogram for malignant neoplasm of breast: Secondary | ICD-10-CM | POA: Diagnosis not present

## 2019-02-07 DIAGNOSIS — R7309 Other abnormal glucose: Secondary | ICD-10-CM | POA: Diagnosis not present

## 2019-02-07 DIAGNOSIS — G2581 Restless legs syndrome: Secondary | ICD-10-CM

## 2019-02-07 DIAGNOSIS — Z0000 Encounter for general adult medical examination without abnormal findings: Secondary | ICD-10-CM | POA: Diagnosis not present

## 2019-02-07 DIAGNOSIS — E559 Vitamin D deficiency, unspecified: Secondary | ICD-10-CM

## 2019-02-07 DIAGNOSIS — Z1211 Encounter for screening for malignant neoplasm of colon: Secondary | ICD-10-CM

## 2019-02-07 NOTE — Patient Instructions (Addendum)
Preventive Care for Adults, Female  A healthy lifestyle and preventive care can promote health and wellness. Preventive health guidelines for women include the following key practices.   A routine yearly physical is a good way to check with your health care provider about your health and preventive screening. It is a chance to share any concerns and updates on your health and to receive a thorough exam.   Visit your dentist for a routine exam and preventive care every 6 months. Brush your teeth twice a day and floss once a day. Good oral hygiene prevents tooth decay and gum disease.   The frequency of eye exams is based on your age, health, family medical history, use of contact lenses, and other factors. Follow your health care provider's recommendations for frequency of eye exams.   Eat a healthy diet. Foods like vegetables, fruits, whole grains, low-fat dairy products, and lean protein foods contain the nutrients you need without too many calories. Decrease your intake of foods high in solid fats, added sugars, and salt. Eat the right amount of calories for you.Get information about a proper diet from your health care provider, if necessary.   Regular physical exercise is one of the most important things you can do for your health. Most adults should get at least 150 minutes of moderate-intensity exercise (any activity that increases your heart rate and causes you to sweat) each week. In addition, most adults need muscle-strengthening exercises on 2 or more days a week.   Maintain a healthy weight. The body mass index (BMI) is a screening tool to identify possible weight problems. It provides an estimate of body fat based on height and weight. Your health care provider can find your BMI, and can help you achieve or maintain a healthy weight.For adults 20 years and older:   - A BMI below 18.5 is considered underweight.   - A BMI of 18.5 to 24.9 is normal.   - A BMI of 25 to 29.9 is  considered overweight.   - A BMI of 30 and above is considered obese.   Maintain normal blood lipids and cholesterol levels by exercising and minimizing your intake of trans and saturated fats.  Eat a balanced diet with plenty of fruit and vegetables. Blood tests for lipids and cholesterol should begin at age 20 and be repeated every 5 years minimum.  If your lipid or cholesterol levels are high, you are over 40, or you are at high risk for heart disease, you may need your cholesterol levels checked more frequently.Ongoing high lipid and cholesterol levels should be treated with medicines if diet and exercise are not working.   If you smoke, find out from your health care provider how to quit. If you do not use tobacco, do not start.   Lung cancer screening is recommended for adults aged 55-80 years who are at high risk for developing lung cancer because of a history of smoking. A yearly low-dose CT scan of the lungs is recommended for people who have at least a 30-pack-year history of smoking and are a current smoker or have quit within the past 15 years. A pack year of smoking is smoking an average of 1 pack of cigarettes a day for 1 year (for example: 1 pack a day for 30 years or 2 packs a day for 15 years). Yearly screening should continue until the smoker has stopped smoking for at least 15 years. Yearly screening should be stopped for people who develop a   health problem that would prevent them from having lung cancer treatment.   If you are pregnant, do not drink alcohol. If you are breastfeeding, be very cautious about drinking alcohol. If you are not pregnant and choose to drink alcohol, do not have more than 1 drink per day. One drink is considered to be 12 ounces (355 mL) of beer, 5 ounces (148 mL) of wine, or 1.5 ounces (44 mL) of liquor.   Avoid use of street drugs. Do not share needles with anyone. Ask for help if you need support or instructions about stopping the use of  drugs.   High blood pressure causes heart disease and increases the risk of stroke. Your blood pressure should be checked at least yearly.  Ongoing high blood pressure should be treated with medicines if weight loss and exercise do not work.   If you are 69-55 years old, ask your health care provider if you should take aspirin to prevent strokes.   Diabetes screening involves taking a blood sample to check your fasting blood sugar level. This should be done once every 3 years, after age 38, if you are within normal weight and without risk factors for diabetes. Testing should be considered at a younger age or be carried out more frequently if you are overweight and have at least 1 risk factor for diabetes.   Breast cancer screening is essential preventive care for women. You should practice "breast self-awareness."  This means understanding the normal appearance and feel of your breasts and may include breast self-examination.  Any changes detected, no matter how small, should be reported to a health care provider.  Women in their 80s and 30s should have a clinical breast exam (CBE) by a health care provider as part of a regular health exam every 1 to 3 years.  After age 66, women should have a CBE every year.  Starting at age 1, women should consider having a mammogram (breast X-ray test) every year.  Women who have a family history of breast cancer should talk to their health care provider about genetic screening.  Women at a high risk of breast cancer should talk to their health care providers about having an MRI and a mammogram every year.   -Breast cancer gene (BRCA)-related cancer risk assessment is recommended for women who have family members with BRCA-related cancers. BRCA-related cancers include breast, ovarian, tubal, and peritoneal cancers. Having family members with these cancers may be associated with an increased risk for harmful changes (mutations) in the breast cancer genes BRCA1 and  BRCA2. Results of the assessment will determine the need for genetic counseling and BRCA1 and BRCA2 testing.   The Pap test is a screening test for cervical cancer. A Pap test can show cell changes on the cervix that might become cervical cancer if left untreated. A Pap test is a procedure in which cells are obtained and examined from the lower end of the uterus (cervix).   - Women should have a Pap test starting at age 57.   - Between ages 90 and 70, Pap tests should be repeated every 2 years.   - Beginning at age 63, you should have a Pap test every 3 years as long as the past 3 Pap tests have been normal.   - Some women have medical problems that increase the chance of getting cervical cancer. Talk to your health care provider about these problems. It is especially important to talk to your health care provider if a  new problem develops soon after your last Pap test. In these cases, your health care provider may recommend more frequent screening and Pap tests.   - The above recommendations are the same for women who have or have not gotten the vaccine for human papillomavirus (HPV).   - If you had a hysterectomy for a problem that was not cancer or a condition that could lead to cancer, then you no longer need Pap tests. Even if you no longer need a Pap test, a regular exam is a good idea to make sure no other problems are starting.   - If you are between ages 36 and 66 years, and you have had normal Pap tests going back 10 years, you no longer need Pap tests. Even if you no longer need a Pap test, a regular exam is a good idea to make sure no other problems are starting.   - If you have had past treatment for cervical cancer or a condition that could lead to cancer, you need Pap tests and screening for cancer for at least 20 years after your treatment.   - If Pap tests have been discontinued, risk factors (such as a new sexual partner) need to be reassessed to determine if screening should  be resumed.   - The HPV test is an additional test that may be used for cervical cancer screening. The HPV test looks for the virus that can cause the cell changes on the cervix. The cells collected during the Pap test can be tested for HPV. The HPV test could be used to screen women aged 70 years and older, and should be used in women of any age who have unclear Pap test results. After the age of 67, women should have HPV testing at the same frequency as a Pap test.   Colorectal cancer can be detected and often prevented. Most routine colorectal cancer screening begins at the age of 57 years and continues through age 26 years. However, your health care provider may recommend screening at an earlier age if you have risk factors for colon cancer. On a yearly basis, your health care provider may provide home test kits to check for hidden blood in the stool.  Use of a small camera at the end of a tube, to directly examine the colon (sigmoidoscopy or colonoscopy), can detect the earliest forms of colorectal cancer. Talk to your health care provider about this at age 23, when routine screening begins. Direct exam of the colon should be repeated every 5 -10 years through age 49 years, unless early forms of pre-cancerous polyps or small growths are found.   People who are at an increased risk for hepatitis B should be screened for this virus. You are considered at high risk for hepatitis B if:  -You were born in a country where hepatitis B occurs often. Talk with your health care provider about which countries are considered high risk.  - Your parents were born in a high-risk country and you have not received a shot to protect against hepatitis B (hepatitis B vaccine).  - You have HIV or AIDS.  - You use needles to inject street drugs.  - You live with, or have sex with, someone who has Hepatitis B.  - You get hemodialysis treatment.  - You take certain medicines for conditions like cancer, organ  transplantation, and autoimmune conditions.   Hepatitis C blood testing is recommended for all people born from [birth year] through 1965 and any individual  with known risks for hepatitis C.   Practice safe sex. Use condoms and avoid high-risk sexual practices to reduce the spread of sexually transmitted infections (STIs). STIs include gonorrhea, chlamydia, syphilis, trichomonas, herpes, HPV, and human immunodeficiency virus (HIV). Herpes, HIV, and HPV are viral illnesses that have no cure. They can result in disability, cancer, and death. Sexually active women aged 25 years and younger should be checked for chlamydia. Older women with new or multiple partners should also be tested for chlamydia. Testing for other STIs is recommended if you are sexually active and at increased risk.   Osteoporosis is a disease in which the bones lose minerals and strength with aging. This can result in serious bone fractures or breaks. The risk of osteoporosis can be identified using a bone density scan. Women ages 65 years and over and women at risk for fractures or osteoporosis should discuss screening with their health care providers. Ask your health care provider whether you should take a calcium supplement or vitamin D to There are also several preventive steps women can take to avoid osteoporosis and resulting fractures or to keep osteoporosis from worsening. -->Recommendations include:  Eat a balanced diet high in fruits, vegetables, calcium, and vitamins.  Get enough calcium. The recommended total intake of is 1,200 mg daily; for best absorption, if taking supplements, divide doses into 250-500 mg doses throughout the day. Of the two types of calcium, calcium carbonate is best absorbed when taken with food but calcium citrate can be taken on an empty stomach.  Get enough vitamin D. NAMS and the National Osteoporosis Foundation recommend at least 1,000 IU per day for women age 50 and over who are at risk of vitamin D  deficiency. Vitamin D deficiency can be caused by inadequate sun exposure (for example, those who live in northern latitudes).  Avoid alcohol and smoking. Heavy alcohol intake (more than 7 drinks per week) increases the risk of falls and hip fracture and women smokers tend to lose bone more rapidly and have lower bone mass than nonsmokers. Stopping smoking is one of the most important changes women can make to improve their health and decrease risk for disease.  Be physically active every day. Weight-bearing exercise (for example, fast walking, hiking, jogging, and weight training) may strengthen bones or slow the rate of bone loss that comes with aging. Balancing and muscle-strengthening exercises can reduce the risk of falling and fracture.  Consider therapeutic medications. Currently, several types of effective drugs are available. Healthcare providers can recommend the type most appropriate for each woman.  Eliminate environmental factors that may contribute to accidents. Falls cause nearly 90% of all osteoporotic fractures, so reducing this risk is an important bone-health strategy. Measures include ample lighting, removing obstructions to walking, using nonskid rugs on floors, and placing mats and/or grab bars in showers.  Be aware of medication side effects. Some common medicines make bones weaker. These include a type of steroid drug called glucocorticoids used for arthritis and asthma, some antiseizure drugs, certain sleeping pills, treatments for endometriosis, and some cancer drugs. An overactive thyroid gland or using too much thyroid hormone for an underactive thyroid can also be a problem. If you are taking these medicines, talk to your doctor about what you can do to help protect your bones.reduce the rate of osteoporosis.    Menopause can be associated with physical symptoms and risks. Hormone replacement therapy is available to decrease symptoms and risks. You should talk to your  health care provider   about whether hormone replacement therapy is right for you.   Use sunscreen. Apply sunscreen liberally and repeatedly throughout the day. You should seek shade when your shadow is shorter than you. Protect yourself by wearing long sleeves, pants, a wide-brimmed hat, and sunglasses year round, whenever you are outdoors.   Once a month, do a whole body skin exam, using a mirror to look at the skin on your back. Tell your health care provider of new moles, moles that have irregular borders, moles that are larger than a pencil eraser, or moles that have changed in shape or color.   -Stay current with required vaccines (immunizations).   Influenza vaccine. All adults should be immunized every year.  Tetanus, diphtheria, and acellular pertussis (Td, Tdap) vaccine. Pregnant women should receive 1 dose of Tdap vaccine during each pregnancy. The dose should be obtained regardless of the length of time since the last dose. Immunization is preferred during the 27th 36th week of gestation. An adult who has not previously received Tdap or who does not know her vaccine status should receive 1 dose of Tdap. This initial dose should be followed by tetanus and diphtheria toxoids (Td) booster doses every 10 years. Adults with an unknown or incomplete history of completing a 3-dose immunization series with Td-containing vaccines should begin or complete a primary immunization series including a Tdap dose. Adults should receive a Td booster every 10 years.  Varicella vaccine. An adult without evidence of immunity to varicella should receive 2 doses or a second dose if she has previously received 1 dose. Pregnant females who do not have evidence of immunity should receive the first dose after pregnancy. This first dose should be obtained before leaving the health care facility. The second dose should be obtained 4 8 weeks after the first dose.  Human papillomavirus (HPV) vaccine. Females aged 13 26  years who have not received the vaccine previously should obtain the 3-dose series. The vaccine is not recommended for use in pregnant females. However, pregnancy testing is not needed before receiving a dose. If a female is found to be pregnant after receiving a dose, no treatment is needed. In that case, the remaining doses should be delayed until after the pregnancy. Immunization is recommended for any person with an immunocompromised condition through the age of 26 years if she did not get any or all doses earlier. During the 3-dose series, the second dose should be obtained 4 8 weeks after the first dose. The third dose should be obtained 24 weeks after the first dose and 16 weeks after the second dose.  Zoster vaccine. One dose is recommended for adults aged 60 years or older unless certain conditions are present.  Measles, mumps, and rubella (MMR) vaccine. Adults born before 1957 generally are considered immune to measles and mumps. Adults born in 1957 or later should have 1 or more doses of MMR vaccine unless there is a contraindication to the vaccine or there is laboratory evidence of immunity to each of the three diseases. A routine second dose of MMR vaccine should be obtained at least 28 days after the first dose for students attending postsecondary schools, health care workers, or international travelers. People who received inactivated measles vaccine or an unknown type of measles vaccine during 1963 1967 should receive 2 doses of MMR vaccine. People who received inactivated mumps vaccine or an unknown type of mumps vaccine before 1979 and are at high risk for mumps infection should consider immunization with 2 doses of   MMR vaccine. For females of childbearing age, rubella immunity should be determined. If there is no evidence of immunity, females who are not pregnant should be vaccinated. If there is no evidence of immunity, females who are pregnant should delay immunization until after pregnancy.  Unvaccinated health care workers born before [birth year] who lack laboratory evidence of measles, mumps, or rubella immunity or laboratory confirmation of disease should consider measles and mumps immunization with 2 doses of MMR vaccine or rubella immunization with 1 dose of MMR vaccine.  Pneumococcal 13-valent conjugate (PCV13) vaccine. When indicated, a person who is uncertain of her immunization history and has no record of immunization should receive the PCV13 vaccine. An adult aged 54 years or older who has certain medical conditions and has not been previously immunized should receive 1 dose of PCV13 vaccine. This PCV13 should be followed with a dose of pneumococcal polysaccharide (PPSV23) vaccine. The PPSV23 vaccine dose should be obtained at least 8 weeks after the dose of PCV13 vaccine. An adult aged 58 years or older who has certain medical conditions and previously received 1 or more doses of PPSV23 vaccine should receive 1 dose of PCV13. The PCV13 vaccine dose should be obtained 1 or more years after the last PPSV23 vaccine dose.  Pneumococcal polysaccharide (PPSV23) vaccine. When PCV13 is also indicated, PCV13 should be obtained first. All adults aged 58 years and older should be immunized. An adult younger than age 65 years who has certain medical conditions should be immunized. Any person who resides in a nursing home or long-term care facility should be immunized. An adult smoker should be immunized. People with an immunocompromised condition and certain other conditions should receive both PCV13 and PPSV23 vaccines. People with human immunodeficiency virus (HIV) infection should be immunized as soon as possible after diagnosis. Immunization during chemotherapy or radiation therapy should be avoided. Routine use of PPSV23 vaccine is not recommended for American Indians, [place] Natives, or people younger than 65 years unless there are medical conditions that require PPSV23 vaccine. When indicated,  people who have unknown immunization and have no record of immunization should receive PPSV23 vaccine. One-time revaccination 5 years after the first dose of PPSV23 is recommended for people aged 70 64 years who have chronic kidney failure, nephrotic syndrome, asplenia, or immunocompromised conditions. People who received 1 2 doses of PPSV23 before age 32 years should receive another dose of PPSV23 vaccine at age 96 years or later if at least 5 years have passed since the previous dose. Doses of PPSV23 are not needed for people immunized with PPSV23 at or after age 55 years.  Meningococcal vaccine. Adults with asplenia or persistent complement component deficiencies should receive 2 doses of quadrivalent meningococcal conjugate (MenACWY-D) vaccine. The doses should be obtained at least 2 months apart. Microbiologists working with certain meningococcal bacteria, [employer] recruits, people at risk during an outbreak, and people who travel to or live in countries with a high rate of meningitis should be immunized. A first-year college student up through age 58 years who is living in a residence hall should receive a dose if she did not receive a dose on or after her 16th birthday. Adults who have certain high-risk conditions should receive one or more doses of vaccine.  Hepatitis A vaccine. Adults who wish to be protected from this disease, have certain high-risk conditions, work with hepatitis A-infected animals, work in hepatitis A research labs, or travel to or work in countries with a high rate of hepatitis A should be  immunized. Adults who were previously unvaccinated and who anticipate close contact with an international adoptee during the first 60 days after arrival in the [place] from a country with a high rate of hepatitis A should be immunized.  Hepatitis B vaccine.  Adults who wish to be protected from this disease, have certain high-risk conditions, may be exposed to blood or other infectious  body fluids, are household contacts or sex partners of hepatitis B positive people, are clients or workers in certain care facilities, or travel to or work in countries with a high rate of hepatitis B should be immunized.  Haemophilus influenzae type b (Hib) vaccine. A previously unvaccinated person with asplenia or sickle cell disease or having a scheduled splenectomy should receive 1 dose of Hib vaccine. Regardless of previous immunization, a recipient of a hematopoietic stem cell transplant should receive a 3-dose series 6 12 months after her successful transplant. Hib vaccine is not recommended for adults with HIV infection.  Preventive Services / Frequency Ages 6 to 39years  Blood pressure check.** / Every 1 to 2 years.  Lipid and cholesterol check.** / Every 5 years beginning at age 39.  Clinical breast exam.** / Every 3 years for women in their 61s and 62s.  BRCA-related cancer risk assessment.** / For women who have family members with a BRCA-related cancer (breast, ovarian, tubal, or peritoneal cancers).  Pap test.** / Every 2 years from ages 47 through 85. Every 3 years starting at age 34 through age 12 or 74 with a history of 3 consecutive normal Pap tests.  HPV screening.** / Every 3 years from ages 46 through ages 43 to 54 with a history of 3 consecutive normal Pap tests.  Hepatitis C blood test.** / For any individual with known risks for hepatitis C.  Skin self-exam. / Monthly.  Influenza vaccine. / Every year.  Tetanus, diphtheria, and acellular pertussis (Tdap, Td) vaccine.** / Consult your health care provider. Pregnant women should receive 1 dose of Tdap vaccine during each pregnancy. 1 dose of Td every 10 years.  Varicella vaccine.** / Consult your health care provider. Pregnant females who do not have evidence of immunity should receive the first dose after pregnancy.  HPV vaccine. / 3 doses over 6 months, if 64 and younger. The vaccine is not recommended for use in  pregnant females. However, pregnancy testing is not needed before receiving a dose.  Measles, mumps, rubella (MMR) vaccine.** / You need at least 1 dose of MMR if you were born in 1957 or later. You may also need a 2nd dose. For females of childbearing age, rubella immunity should be determined. If there is no evidence of immunity, females who are not pregnant should be vaccinated. If there is no evidence of immunity, females who are pregnant should delay immunization until after pregnancy.  Pneumococcal 13-valent conjugate (PCV13) vaccine.** / Consult your health care provider.  Pneumococcal polysaccharide (PPSV23) vaccine.** / 1 to 2 doses if you smoke cigarettes or if you have certain conditions.  Meningococcal vaccine.** / 1 dose if you are age 71 to 37 years and a Market researcher living in a residence hall, or have one of several medical conditions, you need to get vaccinated against meningococcal disease. You may also need additional booster doses.  Hepatitis A vaccine.** / Consult your health care provider.  Hepatitis B vaccine.** / Consult your health care provider.  Haemophilus influenzae type b (Hib) vaccine.** / Consult your health care provider.  Ages 55 to 64years  Blood pressure check.** / Every 1 to 2 years.  Lipid and cholesterol check.** / Every 5 years beginning at age 20 years.  Lung cancer screening. / Every year if you are aged 55 80 years and have a 30-pack-year history of smoking and currently smoke or have quit within the past 15 years. Yearly screening is stopped once you have quit smoking for at least 15 years or develop a health problem that would prevent you from having lung cancer treatment.  Clinical breast exam.** / Every year after age 40 years.  BRCA-related cancer risk assessment.** / For women who have family members with a BRCA-related cancer (breast, ovarian, tubal, or peritoneal cancers).  Mammogram.** / Every year beginning at age 40  years and continuing for as long as you are in good health. Consult with your health care provider.  Pap test.** / Every 3 years starting at age 30 years through age 65 or 70 years with a history of 3 consecutive normal Pap tests.  HPV screening.** / Every 3 years from ages 30 years through ages 65 to 70 years with a history of 3 consecutive normal Pap tests.  Fecal occult blood test (FOBT) of stool. / Every year beginning at age 50 years and continuing until age 75 years. You may not need to do this test if you get a colonoscopy every 10 years.  Flexible sigmoidoscopy or colonoscopy.** / Every 5 years for a flexible sigmoidoscopy or every 10 years for a colonoscopy beginning at age 50 years and continuing until age 75 years.  Hepatitis C blood test.** / For all people born from 1945 through 1965 and any individual with known risks for hepatitis C.  Skin self-exam. / Monthly.  Influenza vaccine. / Every year.  Tetanus, diphtheria, and acellular pertussis (Tdap/Td) vaccine.** / Consult your health care provider. Pregnant women should receive 1 dose of Tdap vaccine during each pregnancy. 1 dose of Td every 10 years.  Varicella vaccine.** / Consult your health care provider. Pregnant females who do not have evidence of immunity should receive the first dose after pregnancy.  Zoster vaccine.** / 1 dose for adults aged 60 years or older.  Measles, mumps, rubella (MMR) vaccine.** / You need at least 1 dose of MMR if you were born in 1957 or later. You may also need a 2nd dose. For females of childbearing age, rubella immunity should be determined. If there is no evidence of immunity, females who are not pregnant should be vaccinated. If there is no evidence of immunity, females who are pregnant should delay immunization until after pregnancy.  Pneumococcal 13-valent conjugate (PCV13) vaccine.** / Consult your health care provider.  Pneumococcal polysaccharide (PPSV23) vaccine.** / 1 to 2 doses if  you smoke cigarettes or if you have certain conditions.  Meningococcal vaccine.** / Consult your health care provider.  Hepatitis A vaccine.** / Consult your health care provider.  Hepatitis B vaccine.** / Consult your health care provider.  Haemophilus influenzae type b (Hib) vaccine.** / Consult your health care provider.  Ages 65 years and over  Blood pressure check.** / Every 1 to 2 years.  Lipid and cholesterol check.** / Every 5 years beginning at age 20 years.  Lung cancer screening. / Every year if you are aged 55 80 years and have a 30-pack-year history of smoking and currently smoke or have quit within the past 15 years. Yearly screening is stopped once you have quit smoking for at least 15 years or develop a health problem that   would prevent you from having lung cancer treatment.  Clinical breast exam.** / Every year after age 103 years.  BRCA-related cancer risk assessment.** / For women who have family members with a BRCA-related cancer (breast, ovarian, tubal, or peritoneal cancers).  Mammogram.** / Every year beginning at age 36 years and continuing for as long as you are in good health. Consult with your health care provider.  Pap test.** / Every 3 years starting at age 5 years through age 85 or 10 years with 3 consecutive normal Pap tests. Testing can be stopped between 65 and 70 years with 3 consecutive normal Pap tests and no abnormal Pap or HPV tests in the past 10 years.  HPV screening.** / Every 3 years from ages 93 years through ages 70 or 45 years with a history of 3 consecutive normal Pap tests. Testing can be stopped between 65 and 70 years with 3 consecutive normal Pap tests and no abnormal Pap or HPV tests in the past 10 years.  Fecal occult blood test (FOBT) of stool. / Every year beginning at age 8 years and continuing until age 45 years. You may not need to do this test if you get a colonoscopy every 10 years.  Flexible sigmoidoscopy or colonoscopy.** /  Every 5 years for a flexible sigmoidoscopy or every 10 years for a colonoscopy beginning at age 69 years and continuing until age 68 years.  Hepatitis C blood test.** / For all people born from [birth year] through 1966 and any individual with known risks for hepatitis C.  Osteoporosis screening.** / A one-time screening for women ages 7 years and over and women at risk for fractures or osteoporosis.  Skin self-exam. / Monthly.  Influenza vaccine. / Every year.  Tetanus, diphtheria, and acellular pertussis (Tdap/Td) vaccine.** / 1 dose of Td every 10 years.  Varicella vaccine.** / Consult your health care provider.  Zoster vaccine.** / 1 dose for adults aged 5 years or older.  Pneumococcal 13-valent conjugate (PCV13) vaccine.** / Consult your health care provider.  Pneumococcal polysaccharide (PPSV23) vaccine.** / 1 dose for all adults aged 74 years and older.  Meningococcal vaccine.** / Consult your health care provider.  Hepatitis A vaccine.** / Consult your health care provider.  Hepatitis B vaccine.** / Consult your health care provider.  Haemophilus influenzae type b (Hib) vaccine.** / Consult your health care provider. ** Family history and personal history of risk and conditions may change your health care provider's recommendations. Document Released: 06/01/2001 Document Revised: 01/24/2013  [facility] Patient Information 2014 [place], [place].   EXERCISE AND DIET:  We recommended that you start or continue a regular exercise program for good health. Regular exercise means any activity that makes your heart beat faster and makes you sweat.  We recommend exercising at least 30 minutes per day at least 3 days a week, preferably 5.  We also recommend a diet low in fat and sugar / carbohydrates.  Inactivity, poor dietary choices and obesity can cause diabetes, heart attack, stroke, and kidney damage, among others.     ALCOHOL AND SMOKING:  Women should limit their alcohol intake to no  more than 7 drinks/beers/glasses of wine (combined, not each!) per week. Moderation of alcohol intake to this level decreases your risk of breast cancer and liver damage.  ( And of course, no recreational drugs are part of a healthy lifestyle.)  Also, you should not be smoking at all or even being exposed to second hand smoke. Most people know smoking can  cause cancer, and various heart and lung diseases, but did you know it also contributes to weakening of your bones?  Aging of your skin?  Yellowing of your teeth and nails?   CALCIUM AND VITAMIN D:  Adequate intake of calcium and Vitamin D are recommended.  The recommendations for exact amounts of these supplements seem to change often, but generally speaking 600 mg of calcium (either carbonate or citrate) and 800 units of Vitamin D per day seems prudent. Certain women may benefit from higher intake of Vitamin D.  If you are among these women, your doctor will have told you during your visit.     PAP SMEARS:  Pap smears, to check for cervical cancer or precancers,  have traditionally been done yearly, although recent scientific advances have shown that most women can have pap smears less often.  However, every woman still should have a physical exam from her gynecologist or primary care physician every year. It will include a breast check, inspection of the vulva and vagina to check for abnormal growths or skin changes, a visual exam of the cervix, and then an exam to evaluate the size and shape of the uterus and ovaries.  And after 48 years of age, a rectal exam is indicated to check for rectal cancers. We will also provide age appropriate advice regarding health maintenance, like when you should have certain vaccines, screening for sexually transmitted diseases, bone density testing, colonoscopy, mammograms, etc.    MAMMOGRAMS:  All women over 36 years old should have a yearly mammogram. Many facilities now offer a "3D" mammogram, which may cost  around $50 extra out of pocket. If possible,  we recommend you accept the option to have the 3D mammogram performed.  It both reduces the number of women who will be called back for extra views which then turn out to be normal, and it is better than the routine mammogram at detecting truly abnormal areas.     COLONOSCOPY:  Colonoscopy to screen for colon cancer is recommended for all women at age 53.  We know, you hate the idea of the prep.  We agree, BUT, having colon cancer and not knowing it is worse!!  Colon cancer so often starts as a polyp that can be seen and removed at colonscopy, which can quite literally save your life!  And if your first colonoscopy is normal and you have no family history of colon cancer, most women don't have to have it again for 10 years.  Once every ten years, you can do something that may end up saving your life, right?  We will be happy to help you get it scheduled when you are ready.  Be sure to check your insurance coverage so you understand how much it will cost.  It may be covered as a preventative service at no cost, but you should check your particular policy.    Remain well hydrated.   Follow Heart Healthy diet. Increase regular exercise.  Recommend at least 30 minutes daily, 5 days per week of walking, biking, swimming, YouTube/Pinterest workout videos. We will call you with lab results. Continue with annual OB/GYN visits. Continue to social distance and wear a mask when in public. Annual physical with fasting labs here. GREAT TO SEE YOU!

## 2019-02-07 NOTE — Assessment & Plan Note (Signed)
Remain well hydrated.   Follow Heart Healthy diet. Increase regular exercise.  Recommend at least 30 minutes daily, 5 days per week of walking, biking, swimming, YouTube/Pinterest workout videos. We will call you with lab results. Continue with annual OB/GYN visits. Continue to social distance and wear a mask when in public. Annual physical with fasting labs here

## 2019-02-07 NOTE — Assessment & Plan Note (Signed)
None in last 12 months

## 2019-02-07 NOTE — Assessment & Plan Note (Signed)
Well controlled with Gabapentin 100mg  2 capsules PRN QHS

## 2019-02-07 NOTE — Addendum Note (Signed)
Addended by: Fonnie Mu on: 02/07/2019 12:00 PM   Modules accepted: Orders

## 2019-02-08 ENCOUNTER — Encounter: Payer: Self-pay | Admitting: Adult Health

## 2019-02-08 LAB — COMPREHENSIVE METABOLIC PANEL
ALT: 17 IU/L (ref 0–32)
AST: 18 IU/L (ref 0–40)
Albumin/Globulin Ratio: 2 (ref 1.2–2.2)
Albumin: 4.5 g/dL (ref 3.8–4.8)
Alkaline Phosphatase: 93 IU/L (ref 39–117)
BUN/Creatinine Ratio: 14 (ref 9–23)
BUN: 10 mg/dL (ref 6–24)
Bilirubin Total: 0.7 mg/dL (ref 0.0–1.2)
CO2: 24 mmol/L (ref 20–29)
Calcium: 9.3 mg/dL (ref 8.7–10.2)
Chloride: 104 mmol/L (ref 96–106)
Creatinine, Ser: 0.69 mg/dL (ref 0.57–1.00)
GFR calc Af Amer: 118 mL/min/{1.73_m2} (ref >59)
GFR calc non Af Amer: 103 mL/min/{1.73_m2} (ref >59)
Globulin, Total: 2.2 g/dL (ref 1.5–4.5)
Glucose: 90 mg/dL (ref 65–99)
Potassium: 3.8 mmol/L (ref 3.5–5.2)
Sodium: 140 mmol/L (ref 134–144)
Total Protein: 6.7 g/dL (ref 6.0–8.5)

## 2019-02-08 LAB — CBC
Hematocrit: 36.9 % (ref 34.0–46.6)
Hemoglobin: 12.7 g/dL (ref 11.1–15.9)
MCH: 32.8 pg (ref 26.6–33.0)
MCHC: 34.4 g/dL (ref 31.5–35.7)
MCV: 95 fL (ref 79–97)
Platelets: 142 x10E3/uL — ABNORMAL LOW (ref 150–450)
RBC: 3.87 x10E6/uL (ref 3.77–5.28)
RDW: 11.9 % (ref 11.7–15.4)
WBC: 3.9 x10E3/uL (ref 3.4–10.8)

## 2019-02-08 LAB — LIPID PANEL
Chol/HDL Ratio: 2.6 ratio (ref 0.0–4.4)
Cholesterol, Total: 175 mg/dL (ref 100–199)
HDL: 68 mg/dL (ref >39)
LDL Chol Calc (NIH): 100 mg/dL — ABNORMAL HIGH (ref 0–99)
Triglycerides: 30 mg/dL (ref 0–149)
VLDL Cholesterol Cal: 7 mg/dL (ref 5–40)

## 2019-02-08 LAB — HEMOGLOBIN A1C
Est. average glucose Bld gHb Est-mCnc: 108 mg/dL
Hgb A1c MFr Bld: 5.4 % (ref 4.8–5.6)

## 2019-02-08 LAB — TSH: TSH: 3.51 u[IU]/mL (ref 0.450–4.500)

## 2019-02-19 ENCOUNTER — Other Ambulatory Visit: Payer: Self-pay

## 2019-02-19 ENCOUNTER — Ambulatory Visit
Admission: RE | Admit: 2019-02-19 | Discharge: 2019-02-19 | Disposition: A | Discharge status: Home / Self Care | Payer: BC Managed Care – PPO | Source: Ambulatory Visit | Attending: Adult Health | Admitting: Adult Health

## 2019-02-19 DIAGNOSIS — Z1231 Encounter for screening mammogram for malignant neoplasm of breast: Secondary | ICD-10-CM

## 2019-02-21 ENCOUNTER — Other Ambulatory Visit: Payer: Self-pay | Admitting: Adult Health

## 2019-02-21 DIAGNOSIS — R928 Other abnormal and inconclusive findings on diagnostic imaging of breast: Secondary | ICD-10-CM

## 2019-02-27 ENCOUNTER — Other Ambulatory Visit: Payer: Self-pay | Admitting: Adult Health

## 2019-02-27 ENCOUNTER — Ambulatory Visit
Admission: RE | Admit: 2019-02-27 | Discharge: 2019-02-27 | Disposition: A | Discharge status: Home / Self Care | Payer: BC Managed Care – PPO | Source: Ambulatory Visit | Attending: Adult Health | Admitting: Adult Health

## 2019-02-27 ENCOUNTER — Other Ambulatory Visit: Payer: Self-pay

## 2019-02-27 DIAGNOSIS — R921 Mammographic calcification found on diagnostic imaging of breast: Secondary | ICD-10-CM | POA: Diagnosis not present

## 2019-02-27 DIAGNOSIS — R928 Other abnormal and inconclusive findings on diagnostic imaging of breast: Secondary | ICD-10-CM

## 2019-03-01 ENCOUNTER — Other Ambulatory Visit: Payer: Self-pay

## 2019-03-01 ENCOUNTER — Ambulatory Visit
Admission: RE | Admit: 2019-03-01 | Discharge: 2019-03-01 | Disposition: A | Discharge status: Home / Self Care | Payer: BC Managed Care – PPO | Source: Ambulatory Visit | Attending: Adult Health | Admitting: Adult Health

## 2019-03-01 DIAGNOSIS — R921 Mammographic calcification found on diagnostic imaging of breast: Secondary | ICD-10-CM

## 2019-03-01 DIAGNOSIS — N6321 Unspecified lump in the left breast, upper outer quadrant: Secondary | ICD-10-CM | POA: Diagnosis not present

## 2019-03-01 DIAGNOSIS — N6012 Diffuse cystic mastopathy of left breast: Secondary | ICD-10-CM | POA: Diagnosis not present

## 2019-03-01 HISTORY — PX: BREAST BIOPSY: SHX20

## 2019-03-25 DIAGNOSIS — B342 Coronavirus infection, unspecified: Secondary | ICD-10-CM | POA: Diagnosis not present

## 2019-03-25 DIAGNOSIS — Z20828 Contact with and (suspected) exposure to other viral communicable diseases: Secondary | ICD-10-CM | POA: Diagnosis not present

## 2019-04-10 DIAGNOSIS — N941 Unspecified dyspareunia: Secondary | ICD-10-CM | POA: Diagnosis not present

## 2019-04-10 DIAGNOSIS — Z6825 Body mass index (BMI) 25.0-25.9, adult: Secondary | ICD-10-CM | POA: Diagnosis not present

## 2019-04-20 HISTORY — PX: COLONOSCOPY: SHX174

## 2019-05-01 DIAGNOSIS — M7712 Lateral epicondylitis, left elbow: Secondary | ICD-10-CM | POA: Diagnosis not present

## 2019-05-01 DIAGNOSIS — M25522 Pain in left elbow: Secondary | ICD-10-CM | POA: Diagnosis not present

## 2019-07-13 DIAGNOSIS — Z6825 Body mass index (BMI) 25.0-25.9, adult: Secondary | ICD-10-CM | POA: Diagnosis not present

## 2019-07-13 DIAGNOSIS — Z01419 Encounter for gynecological examination (general) (routine) without abnormal findings: Secondary | ICD-10-CM | POA: Diagnosis not present

## 2020-01-01 DIAGNOSIS — H16223 Keratoconjunctivitis sicca, not specified as Sjogren's, bilateral: Secondary | ICD-10-CM | POA: Diagnosis not present

## 2020-01-14 ENCOUNTER — Encounter: Payer: Self-pay | Admitting: Physician Assistant

## 2020-01-15 DIAGNOSIS — H6091 Unspecified otitis externa, right ear: Secondary | ICD-10-CM | POA: Diagnosis not present

## 2020-01-15 DIAGNOSIS — H6092 Unspecified otitis externa, left ear: Secondary | ICD-10-CM | POA: Diagnosis not present

## 2020-01-24 ENCOUNTER — Other Ambulatory Visit: Payer: Self-pay | Admitting: Physician Assistant

## 2020-01-24 DIAGNOSIS — Z1231 Encounter for screening mammogram for malignant neoplasm of breast: Secondary | ICD-10-CM

## 2020-01-28 ENCOUNTER — Other Ambulatory Visit: Payer: Self-pay | Admitting: Physician Assistant

## 2020-01-28 DIAGNOSIS — E559 Vitamin D deficiency, unspecified: Secondary | ICD-10-CM

## 2020-01-28 DIAGNOSIS — Z0000 Encounter for general adult medical examination without abnormal findings: Secondary | ICD-10-CM

## 2020-01-30 ENCOUNTER — Other Ambulatory Visit: Payer: BC Managed Care – PPO

## 2020-01-30 ENCOUNTER — Other Ambulatory Visit: Payer: Self-pay

## 2020-01-30 DIAGNOSIS — Z0000 Encounter for general adult medical examination without abnormal findings: Secondary | ICD-10-CM | POA: Diagnosis not present

## 2020-01-30 DIAGNOSIS — E559 Vitamin D deficiency, unspecified: Secondary | ICD-10-CM | POA: Diagnosis not present

## 2020-01-31 ENCOUNTER — Other Ambulatory Visit: Payer: BC Managed Care – PPO

## 2020-01-31 LAB — LIPID PANEL
Chol/HDL Ratio: 2.5 ratio (ref 0.0–4.4)
Cholesterol, Total: 156 mg/dL (ref 100–199)
HDL: 62 mg/dL (ref >39)
LDL Chol Calc (NIH): 86 mg/dL (ref 0–99)
Triglycerides: 33 mg/dL (ref 0–149)
VLDL Cholesterol Cal: 8 mg/dL (ref 5–40)

## 2020-01-31 LAB — VITAMIN D 25 HYDROXY (VIT D DEFICIENCY, FRACTURES): Vit D, 25-Hydroxy: 23.6 ng/mL — ABNORMAL LOW (ref 30.0–100.0)

## 2020-01-31 LAB — HEMOGLOBIN A1C
Est. average glucose Bld gHb Est-mCnc: 111 mg/dL
Hgb A1c MFr Bld: 5.5 % (ref 4.8–5.6)

## 2020-01-31 LAB — CBC
Hematocrit: 37.3 % (ref 34.0–46.6)
Hemoglobin: 12.5 g/dL (ref 11.1–15.9)
MCH: 32.3 pg (ref 26.6–33.0)
MCHC: 33.5 g/dL (ref 31.5–35.7)
MCV: 96 fL (ref 79–97)
Platelets: 129 10*3/uL — ABNORMAL LOW (ref 150–450)
RBC: 3.87 x10E6/uL (ref 3.77–5.28)
RDW: 12 % (ref 11.7–15.4)
WBC: 5.1 10*3/uL (ref 3.4–10.8)

## 2020-01-31 LAB — COMPREHENSIVE METABOLIC PANEL
ALT: 11 IU/L (ref 0–32)
AST: 13 IU/L (ref 0–40)
Albumin/Globulin Ratio: 2.3 — ABNORMAL HIGH (ref 1.2–2.2)
Albumin: 4.2 g/dL (ref 3.8–4.8)
Alkaline Phosphatase: 80 IU/L (ref 44–121)
BUN/Creatinine Ratio: 16 (ref 9–23)
BUN: 12 mg/dL (ref 6–24)
Bilirubin Total: 0.5 mg/dL (ref 0.0–1.2)
CO2: 26 mmol/L (ref 20–29)
Calcium: 8.9 mg/dL (ref 8.7–10.2)
Chloride: 103 mmol/L (ref 96–106)
Creatinine, Ser: 0.73 mg/dL (ref 0.57–1.00)
GFR calc Af Amer: 111 mL/min/{1.73_m2} (ref >59)
GFR calc non Af Amer: 96 mL/min/{1.73_m2} (ref >59)
Globulin, Total: 1.8 g/dL (ref 1.5–4.5)
Glucose: 89 mg/dL (ref 65–99)
Potassium: 4 mmol/L (ref 3.5–5.2)
Sodium: 141 mmol/L (ref 134–144)
Total Protein: 6 g/dL (ref 6.0–8.5)

## 2020-01-31 LAB — TSH: TSH: 3.7 u[IU]/mL (ref 0.450–4.500)

## 2020-02-07 ENCOUNTER — Other Ambulatory Visit: Payer: Self-pay

## 2020-02-07 ENCOUNTER — Encounter: Payer: Self-pay | Admitting: Physician Assistant

## 2020-02-07 ENCOUNTER — Ambulatory Visit (INDEPENDENT_AMBULATORY_CARE_PROVIDER_SITE_OTHER): Payer: BC Managed Care – PPO | Admitting: Physician Assistant

## 2020-02-07 VITALS — BP 113/73 | HR 75 | Temp 98.4°F | Ht 67.5 in | Wt 167.9 lb

## 2020-02-07 DIAGNOSIS — Z1159 Encounter for screening for other viral diseases: Secondary | ICD-10-CM

## 2020-02-07 DIAGNOSIS — Z2821 Immunization not carried out because of patient refusal: Secondary | ICD-10-CM

## 2020-02-07 DIAGNOSIS — Z1211 Encounter for screening for malignant neoplasm of colon: Secondary | ICD-10-CM

## 2020-02-07 DIAGNOSIS — Z114 Encounter for screening for human immunodeficiency virus [HIV]: Secondary | ICD-10-CM | POA: Diagnosis not present

## 2020-02-07 DIAGNOSIS — E663 Overweight: Secondary | ICD-10-CM

## 2020-02-07 DIAGNOSIS — Z6825 Body mass index (BMI) 25.0-25.9, adult: Secondary | ICD-10-CM

## 2020-02-07 DIAGNOSIS — D696 Thrombocytopenia, unspecified: Secondary | ICD-10-CM

## 2020-02-07 DIAGNOSIS — R1013 Epigastric pain: Secondary | ICD-10-CM

## 2020-02-07 DIAGNOSIS — Z0000 Encounter for general adult medical examination without abnormal findings: Secondary | ICD-10-CM

## 2020-02-07 NOTE — Progress Notes (Signed)
Female Physical   Impression and Recommendations:    1. Healthcare maintenance   2. Screening for colon cancer   3. Screening for HIV (human immunodeficiency virus)   4. Need for hepatitis C screening test   5. Thrombocytopenia (HCC)   6. Epigastric pain   7. Influenza vaccination declined   8. Overweight with body mass index (BMI) of 25 to 25.9 in adult      1) Anticipatory Guidance: Discussed skin CA prevention and sunscreen when outside along with skin surveillance; eating a balanced and modest diet; physical activity at least 25 minutes per day or minimum of 150 min/ week moderate to intense activity.  2) Immunizations / Screenings / Labs:   All immunizations are up-to-date per recommendations or will be updated today if pt allows.    - Patient understands with dental and vision screens they will schedule independently.  -Obtained CBC, CMP, HgA1c, Lipid panel, TSH and vit D when fasting.  Most labs are essentially within normal limits or stable from prior.  Platelets have decreased from prior and patient has not been evaluated by hematology and would like a referral. -Is scheduled for mammogram March 07, 2020 -UTD on Pap smear and Tdap -Placed order for screening colonoscopy and hep C and HIV screenings. -Declined influenza vaccine today  3) Weight:  Discussed goal to improve diet habits to improve overall feelings of well being and objective health data. Improve nutrient density of diet through increasing intake of fruits and vegetables and decreasing saturated fats, white flour products and refined sugars. -BMI 25.91  4) Healthcare maintenance: -Follow a heart healthy diet and stay as active as possible. -Uses gabapentin prn for RLS. Continue Vitamin D3 2000 units. -Stay well-hydrated. -Placed hematology referral. -Placed order for abdominal ultrasound to evaluate for epigastric pain/burning sensation that radiates to upper back. -Recommend to avoid provocative foods  (heartburn). -Follow-up in 1 year for CPE and if BW or sooner if needed   No orders of the defined types were placed in this encounter.   Orders Placed This Encounter  Procedures  . US Abdomen Complete  . Hepatitis C antibody  . HIV Antibody (routine testing w rflx)  . Ambulatory referral to Gastroenterology  . Ambulatory referral to Hematology     Return in about 1 year (around 02/06/2021) for CPE and FWB or sooner if needed.     Gross side effects, risk and benefits, and alternatives of medications discussed with patient.  Patient is aware that all medications have potential side effects and we are unable to predict every side effect or drug-drug interaction that may occur.  Expresses verbal understanding and consents to current therapy plan and treatment regimen.  F-up preventative CPE in 1 year- reminded pt again, this is in addition to any chronic care visits.    Please see orders placed and AVS handed out to patient at the end of our visit for further patient instructions/ counseling done pertaining to today's office visit.  Note:  This note was prepared with assistance of Dragon voice recognition software. Occasional wrong-word or sound-a-like substitutions may have occurred due to the inherent limitations of voice recognition software.    Subjective:     CPE HPI: [name] is a 49 y.o. female who presents to [facility] today for a yearly health maintenance exam.   Health Maintenance Summary  - Reviewed and updated, unless pt declines services.  Last Cologuard or Colonoscopy: Placed order for screening colonoscopy. Family  history of Colon CA: Yes  Tobacco History Reviewed:  Y, never smoker Alcohol and/or drug use:    No concerns; no use Dental Home: Yes Eye exams: Yes Dermatology home: No  Female Health:  PAP Smear - last known results: April 28, 2017-normal STD concerns:  none Lumps or breast concerns: none Breast  Cancer Family History: Yes   Additional concerns beyond health maintenance issues: Epigastric pain/burning sensation that sometimes radiates to upper back for about a year.  Takes Tums as needed for heartburn.    Immunization History  Administered Date(s) Administered  . Tdap 02/07/2019     Health Maintenance  Topic Date Due  . Hepatitis C Screening  Never done  . COLONOSCOPY  Never done  . COVID-19 Vaccine (1) 02/23/2020 (Originally 06/03/1982)  . INFLUENZA VACCINE  07/17/2020 (Originally 11/18/2019)  . PAP SMEAR-Modifier  04/28/2020  . MAMMOGRAM  02/26/2021  . TETANUS/TDAP  02/06/2029  . HIV Screening  Completed     Wt Readings from Last 3 Encounters:  02/07/20 167 lb 14.4 oz (76.2 kg)  02/07/19 164 lb 9.6 oz (74.7 kg)  05/30/18 161 lb 12.8 oz (73.4 kg)   BP Readings from Last 3 Encounters:  02/07/20 113/73  02/07/19 (!) 102/59  05/30/18 (!) 143/88   Pulse Readings from Last 3 Encounters:  02/07/20 75  02/07/19 66  05/30/18 74     Past Medical History:  Diagnosis Date  . Acute appendicitis 2012      Past Surgical History:  Procedure Laterality Date  . APPENDECTOMY    . CESAREAN SECTION  1992, 2005  . LAPAROSCOPIC APPENDECTOMY  2012      Family History  Problem Relation Age of Onset  . Cancer Sister        Breast and brain  . Breast cancer Sister 73  . Cancer Mother        colon  . Hypertension Mother   . Hyperlipidemia Father   . Hypertension Father   . Hypothyroidism Father   . Healthy Son   . Alcohol abuse Maternal Uncle   . Cancer Maternal Grandmother        lung  . Hypothyroidism Sister   . Cancer Sister        uterine  . Healthy Son       Social History   Substance and Sexual Activity  Drug Use No  ,   Social History   Substance and Sexual Activity  Alcohol Use No  ,   Social History   Tobacco Use  Smoking Status Never Smoker  Smokeless Tobacco Never Used  ,   Social History   Substance and Sexual Activity   Sexual Activity Yes  . Birth control/protection: I.U.D.    Current Outpatient Medications on File Prior to Visit  Medication Sig Dispense Refill  . Cholecalciferol (VITAMIN D3) 2000 units TABS Take 1 capsule by mouth daily.    . cyclobenzaprine (FLEXERIL) 10 MG tablet Take 1 tablet (10 mg total) by mouth at bedtime. 30 tablet 0  . gabapentin (NEURONTIN) 100 MG capsule 1-2 capsules at bedtime as needed for restless leg syndrome 90 capsule 0  . ibuprofen (ADVIL,MOTRIN) 200 MG tablet Take 200 mg by mouth as needed.    Marland Kitchen levonorgestrel (MIRENA) 20 MCG/24HR IUD 1 each by Intrauterine route once.    . Multiple Vitamin (MULTIVITAMIN) tablet Take 1 tablet by mouth daily.    . Multiple Vitamins-Minerals (ADULT ONE DAILY GUMMIES PO) Take 1 each by mouth daily.    Marland Kitchen  Polyethyl Glycol-Propyl Glycol (SYSTANE OP) Apply 2 drops to eye as needed.    . vitamin B-12 (CYANOCOBALAMIN) 1000 MCG tablet Take 1,000 mcg by mouth daily.     No current facility-administered medications on file prior to visit.    Allergies: Patient has no known allergies.  Review of Systems: General:   Denies fever, chills, unexplained weight loss.  Optho/Auditory:   Denies visual changes, blurred vision/LOV Respiratory:   Denies SOB, DOE more than baseline levels.   Cardiovascular:   Denies chest pain, palpitations, new onset peripheral edema  Gastrointestinal:   Denies vomiting, diarrhea, +abd pain Genitourinary: Denies dysuria, freq/ urgency, flank pain Endocrine:     Denies hot or cold intolerance, polyuria, polydipsia. Musculoskeletal:   Denies unexplained myalgias, joint swelling, unexplained arthralgias, gait problems.  Skin:  Denies rash, suspicious lesions Neurological:     Denies dizziness, unexplained weakness, numbness  Psychiatric/Behavioral:   Denies mood changes, suicidal or homicidal ideations, hallucinations    Objective:    Blood pressure 113/73, pulse 75, temperature 98.4 F (36.9 C), temperature  source Oral, height 5' 7.5" (1.715 m), weight 167 lb 14.4 oz (76.2 kg), SpO2 100 %. Body mass index is 25.91 kg/m. General Appearance:    Alert, cooperative, no distress, appears stated age  Head:    Normocephalic, without obvious abnormality, atraumatic  Eyes:    PERRL, conjunctiva/corneas clear, EOM's intact, both eyes  Ears:    Normal TM's and external ear canals, both ears  Nose:   Nares normal, septum midline, mucosa normal, no drainage    or sinus tenderness  Throat:   Lips w/o lesion, mucosa moist, and tongue normal; teeth and   gums normal  Neck:   Supple, symmetrical, trachea midline, no adenopathy;    thyroid: no enlargement/tenderness/nodules; no carotid   bruit or JVD  Back:     Symmetric, no curvature, ROM normal, no CVA tenderness  Lungs:     Clear to auscultation bilaterally, respirations unlabored, no       Wh/ R/ R  Chest Wall:    No tenderness or gross deformity; normal excursion   Heart:    Regular rate and rhythm, S1 and S2 normal, no murmur  Breast Exam:    No tenderness, masses, or nipple abnormality b/l; no d/c; dense breast tissue noted  Abdomen:     Soft, non-tender, bowel sounds active all four quadrants, No   G/R/R, no masses, no organomegaly  Genitalia:   Deferred to Ob-Gyn  Rectal:   Deferred to Ob-Gyn  Extremities:   Extremities normal, atraumatic, no cyanosis or gross edema  Pulses:   2+ and symmetric all extremities  Skin:   Warm, dry, Skin color, texture, turgor normal, no obvious rashes or lesions Psych: No HI/SI, judgement and insight good, Euthymic mood. Full Affect.  Neurologic:   CNII-XII grossly intact, normal strength, sensation and reflexes throughout

## 2020-02-07 NOTE — Patient Instructions (Addendum)
Preventive Care 40-50 Years Old, Female °Preventive care refers to visits with your health care provider and lifestyle choices that can promote health and wellness. This includes: °· A yearly physical exam. This may also be called an annual well check. °· Regular dental visits and eye exams. °· Immunizations. °· Screening for certain conditions. °· Healthy lifestyle choices, such as eating a healthy diet, getting regular exercise, not using drugs or products that contain nicotine and tobacco, and limiting alcohol use. °What can I expect for my preventive care visit? °Physical exam °Your health care provider will check your: °· Height and weight. This may be used to calculate body mass index (BMI), which tells if you are at a healthy weight. °· Heart rate and blood pressure. °· Skin for abnormal spots. °Counseling °Your health care provider may ask you questions about your: °· Alcohol, tobacco, and drug use. °· Emotional well-being. °· Home and relationship well-being. °· Sexual activity. °· Eating habits. °· Work and work environment. °· Method of birth control. °· Menstrual cycle. °· Pregnancy history. °What immunizations do I need? ° °Influenza (flu) vaccine °· This is recommended every year. °Tetanus, diphtheria, and pertussis (Tdap) vaccine °· You may need a Td booster every 10 years. °Varicella (chickenpox) vaccine °· You may need this if you have not been vaccinated. °Zoster (shingles) vaccine °· You may need this after age 60. °Measles, mumps, and rubella (MMR) vaccine °· You may need at least one dose of MMR if you were born in 1957 or later. You may also need a second dose. °Pneumococcal conjugate (PCV13) vaccine °· You may need this if you have certain conditions and were not previously vaccinated. °Pneumococcal polysaccharide (PPSV23) vaccine °· You may need one or two doses if you smoke cigarettes or if you have certain conditions. °Meningococcal conjugate (MenACWY) vaccine °· You may need this if you  have certain conditions. °Hepatitis A vaccine °· You may need this if you have certain conditions or if you travel or work in places where you may be exposed to hepatitis A. °Hepatitis B vaccine °· You may need this if you have certain conditions or if you travel or work in places where you may be exposed to hepatitis B. °Haemophilus influenzae type b (Hib) vaccine °· You may need this if you have certain conditions. °Human papillomavirus (HPV) vaccine °· If recommended by your health care provider, you may need three doses over 6 months. °You may receive vaccines as individual doses or as more than one vaccine together in one shot (combination vaccines). Talk with your health care provider about the risks and benefits of combination vaccines. °What tests do I need? °Blood tests °· Lipid and cholesterol levels. These may be checked every 5 years, or more frequently if you are over 50 years old. °· Hepatitis C test. °· Hepatitis B test. °Screening °· Lung cancer screening. You may have this screening every year starting at age 55 if you have a 30-pack-year history of smoking and currently smoke or have quit within the past 15 years. °· Colorectal cancer screening. All adults should have this screening starting at age 50 and continuing until age 75. Your health care provider may recommend screening at age 45 if you are at increased risk. You will have tests every 1-10 years, depending on your results and the type of screening test. °· Diabetes screening. This is done by checking your blood sugar (glucose) after you have not eaten for a while (fasting). You may have this   done every 1-3 years.  Mammogram. This may be done every 1-2 years. Talk with your health care provider about when you should start having regular mammograms. This may depend on whether you have a family history of breast cancer.  BRCA-related cancer screening. This may be done if you have a family history of breast, ovarian, tubal, or peritoneal  cancers.  Pelvic exam and Pap test. This may be done every 3 years starting at age 21. Starting at age 30, this may be done every 5 years if you have a Pap test in combination with an HPV test. Other tests  Sexually transmitted disease (STD) testing.  Bone density scan. This is done to screen for osteoporosis. You may have this scan if you are at high risk for osteoporosis. Follow these instructions at home: Eating and drinking  Eat a diet that includes fresh fruits and vegetables, whole grains, lean protein, and low-fat dairy.  Take vitamin and mineral supplements as recommended by your health care provider.  Do not drink alcohol if: ? Your health care provider tells you not to drink. ? You are pregnant, may be pregnant, or are planning to become pregnant.  If you drink alcohol: ? Limit how much you have to 0-1 drink a day. ? Be aware of how much alcohol is in your drink. In the [place] one drink equals one 12 oz bottle of beer (355 mL), one 5 oz glass of wine (148 mL), or one 1 oz glass of hard liquor (44 mL). Lifestyle  Take daily care of your teeth and gums.  Stay active. Exercise for at least 30 minutes on 5 or more days each week.  Do not use any products that contain nicotine or tobacco, such as cigarettes, e-cigarettes, and chewing tobacco. If you need help quitting, ask your health care provider.  If you are sexually active, practice safe sex. Use a condom or other form of birth control (contraception) in order to prevent pregnancy and STIs (sexually transmitted infections).  If told by your health care provider, take low-dose aspirin daily starting at age 50. What's next?  Visit your health care provider once a year for a well check visit.  Ask your health care provider how often you should have your eyes and teeth checked.  Stay up to date on all vaccines. This information is not intended to replace advice given to you by your health care provider. Make sure you  discuss any questions you have with your health care provider. Document Revised: 12/15/2017 Document Reviewed: 12/15/2017 Elsevier Patient Education  2020 [employer]  

## 2020-02-08 LAB — HEPATITIS C ANTIBODY: Hep C Virus Ab: <0.1 s/co ratio (ref 0.0–0.9)

## 2020-02-08 LAB — HIV ANTIBODY (ROUTINE TESTING W REFLEX): HIV Screen 4th Generation wRfx: NONREACTIVE

## 2020-02-11 ENCOUNTER — Telehealth: Payer: Self-pay | Admitting: Physician Assistant

## 2020-02-11 NOTE — Telephone Encounter (Signed)
Received a new hem referral from Jacqueline Masker, PA for thrombocytopenia. [name] has been cld and scheduled to see [name] on 11/12 at 11am. Pt aware to arrive 30 minutes early.

## 2020-02-18 ENCOUNTER — Ambulatory Visit
Admission: RE | Admit: 2020-02-18 | Discharge: 2020-02-18 | Disposition: A | Discharge status: Home / Self Care | Payer: BC Managed Care – PPO | Source: Ambulatory Visit | Attending: Physician Assistant | Admitting: Physician Assistant

## 2020-02-18 DIAGNOSIS — R1013 Epigastric pain: Secondary | ICD-10-CM

## 2020-02-27 NOTE — Progress Notes (Unsigned)
[facility] [phone]   [phone]  CONSULT NOTE  REFERRING PHYSICIAN: Lorrene Reid PA-C  REASON FOR CONSULTATION:  Thrombocytopenia  HPI [name] is a 49 y.o. female with a past medical history significant for TMJ, palpitations, and chronic low back pain is referred to the clinic for evaluation of thrombocytopenia.  The patient recently had a routine follow-up visit with her primary care provider on 02/07/2020.  The patient had routine lab work performed that day which noted a persistently  low platelet count at 129k.  This lab was compared to 1 year prior in which the patient also had a slightly low platelet count at 142k.  The patient was subsequently referred to the clinic today for further evaluation and recommendations regarding this finding.  Of note, the patient also had routine HIV and hepatitis C testing performed on 02/07/2020 which was unremarkable. Additionally, she also had an abdominal ultrasound performed recently for intermittent epigastric pain and heart burn. This ultrasound was unremarkable, including  a normal appearing spleen and liver.   Overall, the patient is feeling well.  She denies any fatigue. She denies any abnormal bleeding including epistaxis, gingival bleeding, hemoptysis, hematemesis, melena, hematochezia, or hematuria. She reports that she bruises fairly easy and has been that way her entire life, which predates her recent thrombocytopenia. Presently, she has a small bruise on her shin as well as one small bruise on her right upper extremity.  The patient denies any recent illnesses or infections. She has had 2 cesarean sections in the past 1 in June 20, 1990 and the other in 2003-06-20.  The patient denies any bleeding complications from the surgery.  She also had her appendix removed in 06-19-08 without any bleeding complications.  She denies any new medications. She rarely takes any medication regularly except for vitamin D and B12.  She state she only takes NSAIDs once every few months. She has not taken the flexeril on her medication list in over a year. She last took gabapentin 2 weeks ago or so. She denies aspirin use or blood thinner use.  She denies taking other OTC medications including herbal supplements. Denies history of liver disease or rheumatological diseases. Denies joint pain, fevers, chills, night sweats, unexpected weight loss, rashes, or joint pain/swelling. She denies history of viral infections including HIV, hepatitis, mono, etc.  She denies recent sore throat, recent antibiotic use, cough, shortness of breath, congestion, dysuria, or skin infections. Denies recent gastrointestinal illness including diarrhea. She denies any particular dietary habits such as being a vegan or vegetarian. She eats red meat 1-2x per week.    The patient's family history consists of many first-degree relatives with malignancies.  The patient's mother was diagnosed with colorectal cancer in 2005-06-19.  The patient's mother also has diabetes, heart disease, and hyperlipidemia.  The patient's sister passed away in Jun 19, 2008 from metastatic breast cancer.  She was diagnosed in her early 15s.  The patient also has another sister who was diagnosed with uterine cancer.  The patient's father has thyroid disease, heart disease, hypertension, and hyperlipidemia.  The patient denies any prior genetic testing or genetic counseling for herself or her family members.  The patient is up-to-date on her mammograms.  She had breast biopsy performed last year which was benign.  The patient was recently referred to [facility] for a routine colonoscopy.  The patient works in [employer].  She is married and has 2 children and 1 grandchild.  She denies any alcohol  use.  She denies any prior history of excessive alcohol use.  The patient denies any drug use.  The patient denies any history of smoking.    HPI  Past Medical History:  Diagnosis Date  . Acute  appendicitis 2012    Past Surgical History:  Procedure Laterality Date  . APPENDECTOMY    . [place], 2005  . LAPAROSCOPIC APPENDECTOMY  2012    Family History  Problem Relation Age of Onset  . Cancer Sister        Breast and brain  . Breast cancer Sister 75  . Cancer Mother        colon  . Hypertension Mother   . Hyperlipidemia Father   . Hypertension Father   . Hypothyroidism Father   . Healthy Son   . Alcohol abuse Maternal Uncle   . Cancer Maternal Grandmother        lung  . Hypothyroidism Sister   . Cancer Sister        uterine  . Healthy Son     Social History Social History   Tobacco Use  . Smoking status: Never Smoker  . Smokeless tobacco: Never Used  Vaping Use  . Vaping Use: Never used  Substance Use Topics  . Alcohol use: No  . Drug use: No    No Known Allergies  Current Outpatient Medications  Medication Sig Dispense Refill  . Cholecalciferol (VITAMIN D3) 2000 units TABS Take 1 capsule by mouth daily.    . cyclobenzaprine (FLEXERIL) 10 MG tablet Take 1 tablet (10 mg total) by mouth at bedtime. 30 tablet 0  . gabapentin (NEURONTIN) 100 MG capsule 1-2 capsules at bedtime as needed for restless leg syndrome 90 capsule 0  . ibuprofen (ADVIL,MOTRIN) 200 MG tablet Take 200 mg by mouth as needed.    Marland Kitchen levonorgestrel (MIRENA) 20 MCG/24HR IUD 1 each by Intrauterine route once.    . Multiple Vitamin (MULTIVITAMIN) tablet Take 1 tablet by mouth daily.    . Multiple Vitamins-Minerals (ADULT ONE DAILY GUMMIES PO) Take 1 each by mouth daily.    Vladimir Faster Glycol-Propyl Glycol (SYSTANE OP) Apply 2 drops to eye as needed.    . vitamin B-12 (CYANOCOBALAMIN) 1000 MCG tablet Take 1,000 mcg by mouth daily.     No current facility-administered medications for this visit.    REVIEW OF SYSTEMS:   Review of Systems  Constitutional: Negative for appetite change, chills, fatigue, fever and unexpected weight change.  HENT: Negative for mouth sores,  nosebleeds, sore throat and trouble swallowing.   Eyes: Negative for eye problems and icterus.  Respiratory: Negative for cough, hemoptysis, shortness of breath and wheezing.   Cardiovascular: Negative for chest pain and leg swelling.  Gastrointestinal: Positive for occasional epigastric discomfort and heart burn. Negative for constipation, diarrhea, nausea and vomiting.  Genitourinary: Negative for bladder incontinence, difficulty urinating, dysuria, frequency and hematuria.   Musculoskeletal: Negative for back pain, gait problem, neck pain and neck stiffness.  Skin: Negative for itching and rash.  Neurological: Negative for dizziness, extremity weakness, gait problem, headaches, light-headedness and seizures.  Hematological: Negative for adenopathy. Does not bruise/bleed easily.  Psychiatric/Behavioral: Negative for confusion, depression and sleep disturbance. The patient is not nervous/anxious.     PHYSICAL EXAMINATION:  Blood pressure 133/71, pulse 76, temperature (!) 96 F (35.6 C), temperature source Tympanic, resp. rate 18, height 5' 7.5" (1.715 m), weight 170 lb 1.6 oz (77.2 kg), SpO2 100 %.  ECOG PERFORMANCE STATUS: 0  Physical  Exam  Constitutional: Oriented to person, place, and time and well-developed, well-nourished, and in no distress.  HENT:  Head: Normocephalic and atraumatic.  Mouth/Throat: Oropharynx is clear and moist. No oropharyngeal exudate.  Eyes: Conjunctivae are normal. Right eye exhibits no discharge. Left eye exhibits no discharge. No scleral icterus.  Neck: Normal range of motion. Neck supple.  Cardiovascular: Normal rate, regular rhythm, normal heart sounds and intact distal pulses.   Pulmonary/Chest: Effort normal and breath sounds normal. No respiratory distress. No wheezes. No rales.  Abdominal: Soft. Bowel sounds are normal. Exhibits no distension and no mass. There is no tenderness.  Musculoskeletal: Normal range of motion. Exhibits no edema.    Lymphadenopathy:    No cervical adenopathy.  Neurological: Alert and oriented to person, place, and time. Exhibits normal muscle tone. Gait normal. Coordination normal.  Skin: Small bruise on shin. Small bruise on right upper extremity. Skin is warm and dry. No rash noted. Not diaphoretic. No erythema. No pallor.  Psychiatric: Mood, memory and judgment normal.  Vitals reviewed.  LABORATORY DATA: Lab Results  Component Value Date   WBC 5.1 01/30/2020   HGB 12.5 01/30/2020   HCT 37.3 01/30/2020   MCV 96 01/30/2020   PLT 129 (L) 01/30/2020      Chemistry      Component Value Date/Time   NA 141 01/30/2020 0809   K 4.0 01/30/2020 0809   CL 103 01/30/2020 0809   CO2 26 01/30/2020 0809   BUN 12 01/30/2020 0809   CREATININE 0.73 01/30/2020 0809      Component Value Date/Time   CALCIUM 8.9 01/30/2020 0809   ALKPHOS 80 01/30/2020 0809   AST 13 01/30/2020 0809   ALT 11 01/30/2020 0809   BILITOT 0.5 01/30/2020 0809       RADIOGRAPHIC STUDIES: US Abdomen Complete  Result Date: 02/18/2020 CLINICAL DATA:  Epigastric pain. EXAM: ABDOMEN ULTRASOUND COMPLETE COMPARISON:  None. FINDINGS: Gallbladder: No gallstones or wall thickening visualized. No sonographic Murphy sign noted by sonographer. Common bile duct: Diameter: 2 mm Liver: No focal lesion identified. Within normal limits in parenchymal echogenicity. Portal vein is patent on color Doppler imaging with normal direction of blood flow towards the liver. IVC: No abnormality visualized. Pancreas: Visualized portion unremarkable. Spleen: Size and appearance within normal limits. Right Kidney: Length: 11.2 cm. Echogenicity within normal limits. No mass or hydronephrosis visualized. Left Kidney: Length: 11.9 cm. Echogenicity within normal limits. No mass or hydronephrosis visualized. Abdominal aorta: No aneurysm visualized. Other findings: None. IMPRESSION: No acute process. Electronically Signed   By: Lovey Newcomer M.D.   On: 02/18/2020 09:31     ASSESSMENT: This is a very pleasant 49 year old caucasian female referred to the clinic for thrombocytopenia  1) Thrombocytopenia -Mild thrombocytopenia first noted in October 2020 as 142k and again on routine labs performed in October 2021 at 129k.  -The patient was seen with Dr. Burr Medico today. Dr. Burr Medico discussed possible etiologies of thrombocytopenia including chronic mild thrombocytopenia, which include autoimmune related (ITP), liver disease, splenomegaly, medication or alcohol induced, chronic infection such as hepatitis C and HIV, and bone marrow disease such as MDS. The other etiology such as infection, malignancy, microangiopathy, DIC a less likely given the indolent course and his clinical presentation. -The patient recently had a CMP, HIV, and Hep C which were unremarkable.  -The patient denies any bleeding complications, hx of liver disease, autoimmune diseases, joint pain, rashes, etc. The patient does not drink alcohol.  -The patient recently had US of  the abdomen which was unremarkable. She has a normal appearing liver/spleen.  -Discussed that given no other cytopenias that Dr. Burr Medico does not feel that a bone marrow biopsy is indicated at this time.  -Dr. Burr Medico discussed given her mild thrombocytopenia, no treatment is recommended at this time unless her platelet count was less than ~30-40k.  -We will arrange for the patient to have blood work including CBC, ESR, ANA, platelet by citrate, and immature platelet fraction performed.  -If her platelet count is within normal limits, no further workup/follow up is recommended at this time and she may continue with her PCP. If her platelet count continues to be low, Dr. Burr Medico will likely arrange for her to be monitored closely for routine blood work twice a year.   2. Family History of Cancer -The patient has an extensive family history of malignancies. Her mother had CRC in her 1's which was diagnosed in 06/29/2005. The patient's sister was  diagnosed with breast cancer in her 66's and passed away in 2008/06/29. She also has another sister who had uterine cancer.  -Dr. Burr Medico is concerned about hereditary syndromes increasing her risk of malignancy, particularly Lynch Syndrome given the constellation of malignancies in her family.  -Dr. Burr Medico discussed the importance of health cancer screening programs. The patient is up to date on her mammograms. She was recently referred to [facility] for her routine colonoscopy by her PCP -Dr. Burr Medico discussed referral to genetic counseling, which is available at our clinic. The patient is interested in this and I will place the referral today.    PLAN: -Obtain blood work with CBC, platelet by citrate, immature platelet fraction, ESR, and ANA. Given the mild thrombocytopenia and that the patient will likely have blood work performed by genetic counseling, the patient will wait until her visit with them before having these lab studies performed.  -Referral to genetic counseling -Will arrange future follow up instructions pending the results of her upcoming lab studies.   Disclaimer: This note was dictated with voice recognition software. Similar sounding words can inadvertently be transcribed and may not be corrected upon review.   Joniyah Mallinger L Jya Hughston February 29, 2020, 1:23 PM

## 2020-02-29 ENCOUNTER — Inpatient Hospital Stay: Payer: BC Managed Care – PPO

## 2020-02-29 ENCOUNTER — Inpatient Hospital Stay: Payer: BC Managed Care – PPO | Attending: Physician Assistant | Admitting: Physician Assistant

## 2020-02-29 ENCOUNTER — Other Ambulatory Visit: Payer: Self-pay

## 2020-02-29 VITALS — BP 133/71 | HR 76 | Temp 96.0°F | Resp 18 | Ht 67.5 in | Wt 170.1 lb

## 2020-02-29 DIAGNOSIS — D696 Thrombocytopenia, unspecified: Secondary | ICD-10-CM

## 2020-02-29 HISTORY — DX: Thrombocytopenia, unspecified: D69.6

## 2020-03-07 ENCOUNTER — Other Ambulatory Visit: Payer: Self-pay

## 2020-03-07 ENCOUNTER — Ambulatory Visit
Admission: RE | Admit: 2020-03-07 | Discharge: 2020-03-07 | Disposition: A | Discharge status: Home / Self Care | Payer: BC Managed Care – PPO | Source: Ambulatory Visit

## 2020-03-07 DIAGNOSIS — Z1231 Encounter for screening mammogram for malignant neoplasm of breast: Secondary | ICD-10-CM

## 2020-03-12 ENCOUNTER — Other Ambulatory Visit: Payer: Self-pay | Admitting: Physician Assistant

## 2020-03-12 DIAGNOSIS — R928 Other abnormal and inconclusive findings on diagnostic imaging of breast: Secondary | ICD-10-CM

## 2020-03-17 ENCOUNTER — Encounter: Payer: Self-pay | Admitting: Gastroenterology

## 2020-03-19 ENCOUNTER — Telehealth: Payer: Self-pay | Admitting: Physician Assistant

## 2020-03-19 NOTE — Telephone Encounter (Signed)
Scheduled appt per 12/1 sch msg - pt is aware of appt date and time   

## 2020-03-22 ENCOUNTER — Other Ambulatory Visit: Payer: Self-pay

## 2020-03-22 ENCOUNTER — Ambulatory Visit
Admission: RE | Admit: 2020-03-22 | Discharge: 2020-03-22 | Disposition: A | Discharge status: Home / Self Care | Payer: BC Managed Care – PPO | Source: Ambulatory Visit | Attending: Physician Assistant | Admitting: Physician Assistant

## 2020-03-22 DIAGNOSIS — R928 Other abnormal and inconclusive findings on diagnostic imaging of breast: Secondary | ICD-10-CM

## 2020-03-22 DIAGNOSIS — Z803 Family history of malignant neoplasm of breast: Secondary | ICD-10-CM | POA: Diagnosis not present

## 2020-03-22 DIAGNOSIS — R921 Mammographic calcification found on diagnostic imaging of breast: Secondary | ICD-10-CM | POA: Diagnosis not present

## 2020-03-25 ENCOUNTER — Encounter: Payer: Self-pay | Admitting: Physician Assistant

## 2020-03-31 ENCOUNTER — Ambulatory Visit (AMBULATORY_SURGERY_CENTER): Payer: BC Managed Care – PPO | Admitting: *Deleted

## 2020-03-31 ENCOUNTER — Other Ambulatory Visit: Payer: Self-pay

## 2020-03-31 VITALS — Ht 67.5 in | Wt 165.0 lb

## 2020-03-31 DIAGNOSIS — Z800 Family history of malignant neoplasm of digestive organs: Secondary | ICD-10-CM

## 2020-03-31 DIAGNOSIS — Z01818 Encounter for other preprocedural examination: Secondary | ICD-10-CM

## 2020-03-31 DIAGNOSIS — Z1211 Encounter for screening for malignant neoplasm of colon: Secondary | ICD-10-CM

## 2020-03-31 MED ORDER — PEG 3350-KCL-NA BICARB-NACL 420 G PO SOLR: 4000.0000 mL | Freq: Once | ORAL | 0 refills | Status: AC

## 2020-03-31 NOTE — Progress Notes (Signed)
Patient's pre-visit was done today over the phone with the patient due to COVID-19 pandemic. Name,DOB and address verified. Insurance verified. Packet of Prep instructions mailed to patient including a copy of a consent form and pre-procedure patient acknowledgement form-pt is aware.  Patient understands to call us back with any questions or concerns. COVID-19 12/23 11 am,patient aware. Pt is aware that care partner can wait in the car during procedure or they may wait in the 4 th floor lobby. Patient is aware to bring only one care partner. Patient and care partner will wear a mask into building.

## 2020-04-04 ENCOUNTER — Encounter: Payer: Self-pay | Admitting: Gastroenterology

## 2020-04-10 ENCOUNTER — Other Ambulatory Visit: Payer: Self-pay | Admitting: Gastroenterology

## 2020-04-10 DIAGNOSIS — Z1159 Encounter for screening for other viral diseases: Secondary | ICD-10-CM | POA: Diagnosis not present

## 2020-04-11 LAB — SARS CORONAVIRUS 2 (TAT 6-24 HRS): SARS Coronavirus 2: NEGATIVE

## 2020-04-15 ENCOUNTER — Ambulatory Visit (AMBULATORY_SURGERY_CENTER): Payer: BC Managed Care – PPO | Admitting: Gastroenterology

## 2020-04-15 ENCOUNTER — Other Ambulatory Visit: Payer: Self-pay

## 2020-04-15 ENCOUNTER — Encounter: Payer: Self-pay | Admitting: Gastroenterology

## 2020-04-15 VITALS — BP 98/62 | HR 54 | Temp 97.3°F | Resp 11 | Ht 67.5 in | Wt 165.0 lb

## 2020-04-15 DIAGNOSIS — Z1211 Encounter for screening for malignant neoplasm of colon: Secondary | ICD-10-CM

## 2020-04-15 DIAGNOSIS — Z800 Family history of malignant neoplasm of digestive organs: Secondary | ICD-10-CM

## 2020-04-15 MED ORDER — SODIUM CHLORIDE 0.9 % IV SOLN: 500.0000 mL | Freq: Once | INTRAVENOUS | Status: DC

## 2020-04-15 NOTE — Progress Notes (Signed)
To PACU, VSS. Report to [email or web address] 

## 2020-04-15 NOTE — Progress Notes (Signed)
Pt's states no medical or surgical changes since previsit or office visit. 

## 2020-04-15 NOTE — Op Note (Signed)
[facility] Patient Name: [name] Procedure Date: 04/15/2020 10:03 AM MRN: [id number] Endoscopist: Rachael Fee , MD Age: 49 Referring MD:  Date of Birth: [birth year] Gender: Female Account #: [id number] Procedure:                Colonoscopy Indications:              Screening for colorectal malignant neoplasm; mother                            had colon cancer in her early 45s Medicines:                Monitored Anesthesia Care Procedure:                Pre-Anesthesia Assessment:                           - Prior to the procedure, a History and Physical                            was performed, and patient medications and                            allergies were reviewed. The patient's tolerance of                            previous anesthesia was also reviewed. The risks                            and benefits of the procedure and the sedation                            options and risks were discussed with the patient.                            All questions were answered, and informed consent                            was obtained. Prior Anticoagulants: The patient has                            taken no previous anticoagulant or antiplatelet                            agents. ASA Grade Assessment: II - A patient with                            mild systemic disease. After reviewing the risks                            and benefits, the patient was deemed in                            satisfactory condition to undergo the procedure.  After obtaining informed consent, the colonoscope                            was passed under direct vision. Throughout the                            procedure, the patient's blood pressure, pulse, and                            oxygen saturations were monitored continuously. The                            Colonoscope was introduced through the anus and                            advanced to the the  cecum, identified by                            appendiceal orifice and ileocecal valve. The                            colonoscopy was performed without difficulty. The                            patient tolerated the procedure well. The quality                            of the bowel preparation was good. The ileocecal                            valve, appendiceal orifice, and rectum were                            photographed. Scope In: 10:11:38 AM Scope Out: 10:20:22 AM Scope Withdrawal Time: 0 hours 5 minutes 50 seconds  Total Procedure Duration: 0 hours 8 minutes 44 seconds  Findings:                 External hemorrhoids were found. The hemorrhoids                            were small.                           The exam was otherwise without abnormality on                            direct and retroflexion views. Complications:            No immediate complications. Estimated blood loss:                            None. Estimated Blood Loss:     Estimated blood loss: none. Impression:               - External hemorrhoids.                           -  The examination was otherwise normal on direct                            and retroflexion views.                           - No specimens collected. Recommendation:           - Patient has a contact number available for                            emergencies. The signs and symptoms of potential                            delayed complications were discussed with the                            patient. Return to normal activities tomorrow.                            Written discharge instructions were provided to the                            patient.                           - Resume previous diet.                           - Continue present medications.                           - Repeat colonoscopy in 5 years for screening. Rachael Fee, MD 04/15/2020 10:22:32 AM This report has been signed electronically.

## 2020-04-15 NOTE — Patient Instructions (Signed)
YOU HAD AN ENDOSCOPIC PROCEDURE TODAY AT THE [facility]:   Refer to the procedure report that was given to you for any specific questions about what was found during the examination.  If the procedure report does not answer your questions, please call your gastroenterologist to clarify.  If you requested that your care partner not be given the details of your procedure findings, then the procedure report has been included in a sealed envelope for you to review at your convenience later.  YOU SHOULD EXPECT: Some feelings of bloating in the abdomen. Passage of more gas than usual.  Walking can help get rid of the air that was put into your GI tract during the procedure and reduce the bloating. If you had a lower endoscopy (such as a colonoscopy or flexible sigmoidoscopy) you may notice spotting of blood in your stool or on the toilet paper. If you underwent a bowel prep for your procedure, you may not have a normal bowel movement for a few days.  Please Note:  You might notice some irritation and congestion in your nose or some drainage.  This is from the oxygen used during your procedure.  There is no need for concern and it should clear up in a day or so.  SYMPTOMS TO REPORT IMMEDIATELY:   Following lower endoscopy (colonoscopy or flexible sigmoidoscopy):  Excessive amounts of blood in the stool  Significant tenderness or worsening of abdominal pains  Swelling of the abdomen that is new, acute  Fever of 100F or higher  For urgent or emergent issues, a gastroenterologist can be reached at any hour by calling [phone]. Do not use MyChart messaging for urgent concerns.    DIET:  We do recommend a small meal at first, but then you may proceed to your regular diet.  Drink plenty of fluids but you should avoid alcoholic beverages for 24 hours.  ACTIVITY:  You should plan to take it easy for the rest of today and you should NOT DRIVE or use heavy machinery until tomorrow (because  of the sedation medicines used during the test).    FOLLOW UP: Our staff will call the number listed on your records 48-72 hours following your procedure to check on you and address any questions or concerns that you may have regarding the information given to you following your procedure. If we do not reach you, we will leave a message.  We will attempt to reach you two times.  During this call, we will ask if you have developed any symptoms of COVID 19. If you develop any symptoms (ie: fever, flu-like symptoms, shortness of breath, cough etc.) before then, please call [phone].  If you test positive for Covid 19 in the 2 weeks post procedure, please call and report this information to us.    If any biopsies were taken you will be contacted by phone or by letter within the next 1-3 weeks.  Please call us at [phone] if you have not heard about the biopsies in 3 weeks.    SIGNATURES/CONFIDENTIALITY: You and/or your care partner have signed paperwork which will be entered into your electronic medical record.  These signatures attest to the fact that that the information above on your After Visit Summary has been reviewed and is understood.  Full responsibility of the confidentiality of this discharge information lies with you and/or your care-partner. 

## 2020-04-16 ENCOUNTER — Other Ambulatory Visit: Payer: Self-pay

## 2020-04-16 ENCOUNTER — Other Ambulatory Visit: Payer: Self-pay | Admitting: Genetic Counselor

## 2020-04-16 ENCOUNTER — Telehealth: Payer: Self-pay

## 2020-04-16 ENCOUNTER — Inpatient Hospital Stay: Payer: BC Managed Care – PPO

## 2020-04-16 ENCOUNTER — Encounter: Payer: Self-pay | Admitting: Genetic Counselor

## 2020-04-16 ENCOUNTER — Telehealth: Payer: Self-pay | Admitting: *Deleted

## 2020-04-16 ENCOUNTER — Inpatient Hospital Stay: Payer: BC Managed Care – PPO | Attending: Physician Assistant | Admitting: Genetic Counselor

## 2020-04-16 DIAGNOSIS — Z803 Family history of malignant neoplasm of breast: Secondary | ICD-10-CM | POA: Diagnosis not present

## 2020-04-16 DIAGNOSIS — D696 Thrombocytopenia, unspecified: Secondary | ICD-10-CM

## 2020-04-16 DIAGNOSIS — Z8049 Family history of malignant neoplasm of other genital organs: Secondary | ICD-10-CM | POA: Diagnosis not present

## 2020-04-16 DIAGNOSIS — Z800 Family history of malignant neoplasm of digestive organs: Secondary | ICD-10-CM | POA: Insufficient documentation

## 2020-04-16 LAB — CBC WITH DIFFERENTIAL (CANCER CENTER ONLY)
Abs Immature Granulocytes: 0.01 10*3/uL (ref 0.00–0.07)
Basophils Absolute: 0 10*3/uL (ref 0.0–0.1)
Basophils Relative: 0 %
Eosinophils Absolute: 0.1 10*3/uL (ref 0.0–0.5)
Eosinophils Relative: 2 %
HCT: 39.4 % (ref 36.0–46.0)
Hemoglobin: 12.8 g/dL (ref 12.0–15.0)
Immature Granulocytes: 0 %
Lymphocytes Relative: 30 %
Lymphs Abs: 1.4 10*3/uL (ref 0.7–4.0)
MCH: 32.2 pg (ref 26.0–34.0)
MCHC: 32.5 g/dL (ref 30.0–36.0)
MCV: 99.2 fL (ref 80.0–100.0)
Monocytes Absolute: 0.3 10*3/uL (ref 0.1–1.0)
Monocytes Relative: 6 %
Neutro Abs: 2.8 10*3/uL (ref 1.7–7.7)
Neutrophils Relative %: 62 %
Platelet Count: 167 10*3/uL (ref 150–400)
RBC: 3.97 MIL/uL (ref 3.87–5.11)
RDW: 12 % (ref 11.5–15.5)
WBC Count: 4.5 10*3/uL (ref 4.0–10.5)
nRBC: 0 % (ref 0.0–0.2)

## 2020-04-16 LAB — PLATELET BY CITRATE

## 2020-04-16 LAB — GENETIC SCREENING ORDER

## 2020-04-16 LAB — SEDIMENTATION RATE: Sed Rate: 7 mm/hr (ref 0–22)

## 2020-04-16 LAB — IMMATURE PLATELET FRACTION: Immature Platelet Fraction: 4.3 % (ref 1.2–8.6)

## 2020-04-16 NOTE — Telephone Encounter (Signed)
Left message on f/u call 

## 2020-04-16 NOTE — Progress Notes (Signed)
REFERRING PROVIDER: Lorrene Reid, PA-C [place],  [place] [address]  PRIMARY PROVIDER:  Lorrene Reid, PA-C  PRIMARY REASON FOR VISIT:  1. Family history of breast cancer   2. Family history of colon cancer   3. Family history of uterine cancer      HISTORY OF PRESENT ILLNESS:   [name], a 49 y.o. female, was seen for a [facility] cancer genetics consultation at the request of Dr. Mariel Kansky due to a family history of cancer.  [name] presents to clinic today to discuss the possibility of a hereditary predisposition to cancer, genetic testing, and to further clarify her future cancer risks, as well as potential cancer risks for family members.   [name] is a 49 y.o. female with no personal history of cancer.    CANCER HISTORY:  Oncology History   No history exists.     RISK FACTORS:  Menarche was at age 84.  First live birth at age 1.  OCP use for approximately 10+ years.  Ovaries intact: yes.  Hysterectomy: no.  Menopausal status: perimenopausal.  HRT use: 0 years. Colonoscopy: yes; normal. Mammogram within the last year: yes. Number of breast biopsies: 1. Up to date with pelvic exams: yes. Any excessive radiation exposure in the past: no  Past Medical History:  Diagnosis Date   Acute appendicitis 2012   Family history of breast cancer    Family history of breast cancer    Family history of colon cancer    Family history of uterine cancer     Past Surgical History:  Procedure Laterality Date   APPENDECTOMY     CESAREAN SECTION  1992, 2005   LAPAROSCOPIC APPENDECTOMY  2012    Social History   Socioeconomic History   Marital status: Married    Spouse name: Not on file   Number of children: 2   Years of education: Not on file   Highest education level: Not on file  Occupational History   Occupation: Advertising account planner: [employer]  Tobacco Use   Smoking status: Never Smoker   Smokeless  tobacco: Never Used  Scientific laboratory technician Use: Never used  Substance and Sexual Activity   Alcohol use: No   Drug use: No   Sexual activity: Yes    Birth control/protection: I.U.D.  Other Topics Concern   Not on file  Social History Narrative   Not on file   Social Determinants of Health   Financial Resource Strain: Not on file  Food Insecurity: Not on file  Transportation Needs: Not on file  Physical Activity: Not on file  Stress: Not on file  Social Connections: Not on file     FAMILY HISTORY:  We obtained a detailed, 4-generation family history.  Significant diagnoses are listed below: Family History  Problem Relation Age of Onset   Cancer Sister        Breast and brain   Breast cancer Sister 38   Cancer Mother        colon   Hypertension Mother    Colon cancer Mother 19   Hyperlipidemia Father    Hypertension Father    Hypothyroidism Father    Healthy Son    Alcohol abuse Maternal Uncle    Cancer Maternal Grandmother        lung   Kidney disease Maternal Grandfather    Heart disease Paternal Grandmother    Pneumonia Paternal Grandfather    Hypothyroidism Sister  Uterine cancer Sister 55   Healthy Son    Colon cancer Paternal Uncle        d. 32   Colon polyps Neg Hx    Esophageal cancer Neg Hx    Rectal cancer Neg Hx    Stomach cancer Neg Hx     The patient has two sons who are cancer free.  She has three sisters, one who is cancer free, one who had uterine cancer at 31 and a sister who had breast cancer diagnosed at 46 and died at 18.  Both parents are living.  The patient's father is living at 27.  He had three brothers who are all deceased.  One died at 69 from colon cancer.  Both paternal grandparents are deceased from non-cancer related issues.  The patient's mother was diagnosed with colon cancer at 54.  She had three sisters and two brothers who are cancer free.  The maternal grandparents are deceased, the grandmother  had lung cancer.  [name] is unaware of previous family history of genetic testing for hereditary cancer risks. Patient's maternal ancestors are of Caucasian descent, and paternal ancestors are of Caucasian descent. There is no reported Ashkenazi Jewish ancestry. There is no known consanguinity.  GENETIC COUNSELING ASSESSMENT: [name] is a 49 y.o. female with a family history of cancer which is somewhat suggestive of a hereditary cancer syndrome and predisposition to cancer given the young ages of onset and combination of cancer. We, therefore, discussed and recommended the following at today's visit.   DISCUSSION: We discussed that 5 - 10% of cancer is hereditary, and that each type of cancer has its own risk for hereditary-ness.  For instance, about 5-7% of colon cancer and 3% of uterine cancer is considered to be hereditary, with most cases associated with Lynch syndrome.  Based on her father's brother who died at 31 and the young age of onset of uterine cancer in her sister, there is a concern for Lynch syndrome.  Her mother also had colon cancer, although it was not at a young age.  With her sister having breast cancer at 40 there is also a risk for hereditary breast cancer syndromes.  There are other genes that can be associated with hereditary breast cancer syndromes.  These include BRCA1, BRCA2, ATM, CHEK2 and PALB2.  We discussed that testing is beneficial for several reasons including knowing how to follow individuals and understand if other family members could be at risk for cancer and allow them to undergo genetic testing.   We reviewed the characteristics, features and inheritance patterns of hereditary cancer syndromes. We also discussed genetic testing, including the appropriate family members to test, the process of testing, insurance coverage and turn-around-time for results. We discussed the implications of a negative, positive, carrier and/or variant of uncertain significant  result. We recommended [name] pursue genetic testing for the CancerNext-Expanded+RNAinsight gene panel. The CancerNext-Expanded gene panel offered by [facility] and includes sequencing and rearrangement analysis for the following 77 genes: AIP, ALK, APC*, ATM*, AXIN2, BAP1, BARD1, BLM, BMPR1A, BRCA1*, BRCA2*, BRIP1*, CDC73, CDH1*, CDK4, CDKN1B, CDKN2A, CHEK2*, CTNNA1, DICER1, FANCC, FH, FLCN, GALNT12, KIF1B, LZTR1, MAX, MEN1, MET, MLH1*, MSH2*, MSH3, MSH6*, MUTYH*, NBN, NF1*, NF2, NTHL1, PALB2*, PHOX2B, PMS2*, POT1, PRKAR1A, PTCH1, PTEN*, RAD51C*, RAD51D*, RB1, RECQL, RET, SDHA, SDHAF2, SDHB, SDHC, SDHD, SMAD4, SMARCA4, SMARCB1, SMARCE1, STK11, SUFU, TMEM127, TP53*, TSC1, TSC2, VHL and XRCC2 (sequencing and deletion/duplication); EGFR, EGLN1, HOXB13, KIT, MITF, PDGFRA, POLD1, and POLE (sequencing only); EPCAM and  GREM1 (deletion/duplication only). DNA and RNA analyses performed for * genes.   Based on [name]'s family history of cancer, she meets medical criteria for genetic testing. Despite that she meets criteria, she may still have an out of pocket cost. We discussed that if her out of pocket cost for testing is over $100, the laboratory will call and confirm whether she wants to proceed with testing.  If the out of pocket cost of testing is less than $100 she will be billed by the genetic testing laboratory.   Based on the patient's family history, a statistical model (Tyrer Cusik) was used to estimate her risk of developing breast. This estimates her lifetime risk of developing breast to be approximately 19.9%. This estimation does not consider any genetic testing results.  The patient's lifetime breast cancer risk is a preliminary estimate based on available information using one of several models endorsed by the [employer] (ACS). The ACS recommends consideration of breast MRI screening as an adjunct to mammography for patients at high risk (defined as 20% or greater lifetime  risk). Please note that a woman's breast cancer risk changes over time. It may increase or decrease based on age and any changes to the personal and/or family medical history. The risks and recommendations listed above apply to this patient at this point in time. In the future, she may or may not be eligible for the same medical management strategies and, in some cases, other medical management strategies may become available to her. If she is interested in an updated breast cancer risk assessment at a later date, she can contact us.   PLAN: After considering the risks, benefits, and limitations, [name] provided informed consent to pursue genetic testing and the blood sample was sent to [facility] for analysis of the CancerNext-Expanded+RNAinsight. Results should be available within approximately 2-3 weeks' time, at which point they will be disclosed by telephone to [name], as will any additional recommendations warranted by these results. [name] will receive a summary of her genetic counseling visit and a copy of her results once available. This information will also be available in Epic.   Lastly, we encouraged [name] to remain in contact with cancer genetics annually so that we can continuously update the family history and inform her of any changes in cancer genetics and testing that may be of benefit for this family.   [name] questions were answered to her satisfaction today. Our contact information was provided should additional questions or concerns arise. Thank you for the referral and allowing [place] to share in the care of your patient.   Sander Remedios P. Florene Glen, [place], [facility] Licensed, Insurance risk surveyor [name] phone: [phone]  The patient was seen for a total of 40 minutes in face-to-face genetic counseling.  This patient was discussed with Drs. Magrinat, Lindi Adie and/or Burr Medico who agrees with the above.     _______________________________________________________________________ For Office Staff:  Number of people involved in session: 1 Was an Intern/ student involved with case: no

## 2020-04-16 NOTE — Telephone Encounter (Signed)
Left message on answering machine. 

## 2020-04-18 LAB — ANTINUCLEAR ANTIBODIES, IFA: ANA Ab, IFA: NEGATIVE

## 2020-04-22 ENCOUNTER — Telehealth: Payer: Self-pay | Admitting: Physician Assistant

## 2020-04-22 NOTE — Telephone Encounter (Signed)
I called the patient to review her lab work with her. Platelet count WNL. ANA, immature platelet fraction, platelet by citrate, sed rate were unremarkable. The patient had an appointment with genetic counseling given her extensive family history for malignancy. Testing is still pending. Discussed no treatment or further work up necessary at this point regarding her mild intermittent thrombocytopenia. In the absence of other cytopenias, would not recommend a bone marrow biopsy and aspirate at this time. Would not consider treatment unless platelet count <30k. The patient may continue to have routine blood work performed by her PCP. We would always be happy to see her back if she develops persistent or worsening thrombocytopenia.  

## 2020-05-05 ENCOUNTER — Telehealth: Payer: Self-pay | Admitting: Genetic Counselor

## 2020-05-05 ENCOUNTER — Encounter: Payer: Self-pay | Admitting: Genetic Counselor

## 2020-05-05 DIAGNOSIS — Z1379 Encounter for other screening for genetic and chromosomal anomalies: Secondary | ICD-10-CM | POA: Insufficient documentation

## 2020-05-05 NOTE — Telephone Encounter (Signed)
LM on VM that results are back and to please call. 

## 2020-05-08 NOTE — Telephone Encounter (Signed)
Left 2nd message that results are back and to please call.  Left CB instructions.

## 2020-05-13 ENCOUNTER — Ambulatory Visit: Payer: Self-pay | Admitting: Genetic Counselor

## 2020-05-13 DIAGNOSIS — Z1379 Encounter for other screening for genetic and chromosomal anomalies: Secondary | ICD-10-CM

## 2020-05-13 NOTE — Progress Notes (Signed)
HPI:  Ms. Potenza was previously seen in the [facility] clinic due to a family history of cancer and concerns regarding a hereditary predisposition to cancer. Please refer to our prior cancer genetics clinic note for more information regarding our discussion, assessment and recommendations, at the time. Ms. Guastella recent genetic test results were disclosed to her, as were recommendations warranted by these results. These results and recommendations are discussed in more detail below.  CANCER HISTORY:  Oncology History   No history exists.    FAMILY HISTORY:  We obtained a detailed, 4-generation family history.  Significant diagnoses are listed below: Family History  Problem Relation Age of Onset  . Cancer Sister        Breast and brain  . Breast cancer Sister 91  . Cancer Mother        colon  . Hypertension Mother   . Colon cancer Mother 43  . Hyperlipidemia Father   . Hypertension Father   . Hypothyroidism Father   . Healthy Son   . Alcohol abuse Maternal Uncle   . Cancer Maternal Grandmother        lung  . Kidney disease Maternal Grandfather   . Heart disease Paternal Grandmother   . Pneumonia Paternal Grandfather   . Hypothyroidism Sister   . Uterine cancer Sister 39  . Healthy Son   . Colon cancer Paternal Uncle        d. 74  . Colon polyps Neg Hx   . Esophageal cancer Neg Hx   . Rectal cancer Neg Hx   . Stomach cancer Neg Hx     The patient has two sons who are cancer free.  She has three sisters, one who is cancer free, one who had uterine cancer at 42 and a sister who had breast cancer diagnosed at 46 and died at 73.  Both parents are living.  The patient's father is living at 55.  He had three brothers who are all deceased.  One died at 62 from colon cancer.  Both paternal grandparents are deceased from non-cancer related issues.  The patient's mother was diagnosed with colon cancer at 71.  She had three sisters and two brothers who are cancer  free.  The maternal grandparents are deceased, the grandmother had lung cancer.  Ms. Balducci is unaware of previous family history of genetic testing for hereditary cancer risks. Patient's maternal ancestors are of Caucasian descent, and paternal ancestors are of Caucasian descent. There is no reported Ashkenazi Jewish ancestry. There is no known consanguinity.  GENETIC TEST RESULTS: Genetic testing reported out on April 25, 2020 through the CancerNext-Extended+RNAinsight cancer panel found no pathogenic mutations. The CancerNext-Expanded gene panel offered by [facility] and includes sequencing and rearrangement analysis for the following 77 genes: AIP, ALK, APC*, ATM*, AXIN2, BAP1, BARD1, BLM, BMPR1A, BRCA1*, BRCA2*, BRIP1*, CDC73, CDH1*, CDK4, CDKN1B, CDKN2A, CHEK2*, CTNNA1, DICER1, FANCC, FH, FLCN, GALNT12, KIF1B, LZTR1, MAX, MEN1, MET, MLH1*, MSH2*, MSH3, MSH6*, MUTYH*, NBN, NF1*, NF2, NTHL1, PALB2*, PHOX2B, PMS2*, POT1, PRKAR1A, PTCH1, PTEN*, RAD51C*, RAD51D*, RB1, RECQL, RET, SDHA, SDHAF2, SDHB, SDHC, SDHD, SMAD4, SMARCA4, SMARCB1, SMARCE1, STK11, SUFU, TMEM127, TP53*, TSC1, TSC2, VHL and XRCC2 (sequencing and deletion/duplication); EGFR, EGLN1, HOXB13, KIT, MITF, PDGFRA, POLD1, and POLE (sequencing only); EPCAM and GREM1 (deletion/duplication only). DNA and RNA analyses performed for * genes. The test report has been scanned into EPIC and is located under the Molecular Pathology section of the Results Review tab.  A portion of the result report  is included below for reference.     We discussed with Ms. Orgeron that because current genetic testing is not perfect, it is possible there may be a gene mutation in one of these genes that current testing cannot detect, but that chance is small.  We also discussed, that there could be another gene that has not yet been discovered, or that we have not yet tested, that is responsible for the cancer diagnoses in the family. It is also possible there is a  hereditary cause for the cancer in the family that Ms. Lawes did not inherit and therefore was not identified in her testing.  Therefore, it is important to remain in touch with cancer genetics in the future so that we can continue to offer Ms. Plante the most up to date genetic testing.   ADDITIONAL GENETIC TESTING: We discussed with Ms. Fehnel that her genetic testing was fairly extensive.  If there are genes identified to increase cancer risk that can be analyzed in the future, we would be happy to discuss and coordinate this testing at that time.    CANCER SCREENING RECOMMENDATIONS: Ms. Santellan test result is considered negative (normal).  This means that we have not identified a hereditary cause for her family history of cancer at this time. Most cancers happen by chance and this negative test suggests that her cancer may fall into this category.    While reassuring, this does not definitively rule out a hereditary predisposition to cancer. It is still possible that there could be genetic mutations that are undetectable by current technology. There could be genetic mutations in genes that have not been tested or identified to increase cancer risk.  Therefore, it is recommended she continue to follow the cancer management and screening guidelines provided by her primary healthcare provider.   An individual's cancer risk and medical management are not determined by genetic test results alone. Overall cancer risk assessment incorporates additional factors, including personal medical history, family history, and any available genetic information that may result in a personalized plan for cancer prevention and surveillance  RECOMMENDATIONS FOR FAMILY MEMBERS:  Individuals in this family might be at some increased risk of developing cancer, over the general population risk, simply due to the family history of cancer.  We recommended women in this family have a yearly mammogram beginning at age 21, or  72 years younger than the earliest onset of cancer, an annual clinical breast exam, and perform monthly breast self-exams. Women in this family should also have a gynecological exam as recommended by their primary provider. All family members should be referred for colonoscopy starting at age 46.  It is also possible there is a hereditary cause for the cancer in Ms. Mcconaghy's family that she did not inherit and therefore was not identified in her.  Based on Ms. Versteeg's family history, we recommended her mother, who was diagnosed with colon cancer in her 55's, or her sister who was diagnosed with uterine cancer at age 51 or 24, have genetic counseling and testing. Ms. Depaoli will let us know if we can be of any assistance in coordinating genetic counseling and/or testing for this family member.   FOLLOW-UP: Lastly, we discussed with Ms. Bordeau that cancer genetics is a rapidly advancing field and it is possible that new genetic tests will be appropriate for her and/or her family members in the future. We encouraged her to remain in contact with cancer genetics on an annual basis so we can update her  personal and family histories and let her know of advances in cancer genetics that may benefit this family.   Our contact number was provided. Ms. Malveaux questions were answered to her satisfaction, and she knows she is welcome to call us at anytime with additional questions or concerns.   Roma Kayser, [place], [place] Licensed, Certified Genetic Counselor [email or web address]

## 2020-05-13 NOTE — Telephone Encounter (Signed)
Revealed negative genetic testing.  Discussed that we do not know why there is cancer in the family. It could be due to a different gene that we are not testing, or maybe our current technology may not be able to pick something up.  It will be important for her to keep in contact with genetics to keep up with whether additional testing may be needed.  

## 2020-08-04 ENCOUNTER — Encounter: Payer: Self-pay | Admitting: Physician Assistant

## 2020-08-04 DIAGNOSIS — R928 Other abnormal and inconclusive findings on diagnostic imaging of breast: Secondary | ICD-10-CM

## 2020-08-28 DIAGNOSIS — Z6826 Body mass index (BMI) 26.0-26.9, adult: Secondary | ICD-10-CM | POA: Diagnosis not present

## 2020-08-28 DIAGNOSIS — Z01419 Encounter for gynecological examination (general) (routine) without abnormal findings: Secondary | ICD-10-CM | POA: Diagnosis not present

## 2020-09-30 DIAGNOSIS — H9202 Otalgia, left ear: Secondary | ICD-10-CM | POA: Diagnosis not present

## 2020-09-30 DIAGNOSIS — R059 Cough, unspecified: Secondary | ICD-10-CM | POA: Diagnosis not present

## 2020-09-30 DIAGNOSIS — Z20828 Contact with and (suspected) exposure to other viral communicable diseases: Secondary | ICD-10-CM | POA: Diagnosis not present

## 2020-09-30 DIAGNOSIS — J0100 Acute maxillary sinusitis, unspecified: Secondary | ICD-10-CM | POA: Diagnosis not present

## 2020-10-01 ENCOUNTER — Other Ambulatory Visit: Payer: Self-pay

## 2020-10-01 ENCOUNTER — Telehealth: Payer: Self-pay | Admitting: Physician Assistant

## 2020-10-01 ENCOUNTER — Encounter: Payer: Self-pay | Admitting: Physician Assistant

## 2020-10-01 DIAGNOSIS — H9312 Tinnitus, left ear: Secondary | ICD-10-CM

## 2020-10-01 NOTE — Telephone Encounter (Signed)
Patient called back to see if we could send referral to ENT somewhere else due to long appointment wait times with Dr. Ezzard Standing. Insturcted patient to call her insurance company to find in-network ENT's and to call them when they scheduling to be sooner quicker. Will call back tomorrow to follow-up.

## 2020-10-01 NOTE — Telephone Encounter (Signed)
ENT referral has been placed per Maritza's instruction. Patient was seen at urgent care on 09/30/2020 and was told she had dried blood in her left ear. She reported having a low grade fever and given prednisone and antibiotic.

## 2020-10-01 NOTE — Telephone Encounter (Signed)
Patient is returning your call,thanks. °

## 2020-10-13 ENCOUNTER — Other Ambulatory Visit: Payer: Self-pay | Admitting: Physician Assistant

## 2020-10-13 ENCOUNTER — Other Ambulatory Visit: Payer: Self-pay

## 2020-10-13 ENCOUNTER — Ambulatory Visit
Admission: RE | Admit: 2020-10-13 | Discharge: 2020-10-13 | Disposition: A | Discharge status: Home / Self Care | Payer: BC Managed Care – PPO | Source: Ambulatory Visit | Attending: Physician Assistant | Admitting: Physician Assistant

## 2020-10-13 DIAGNOSIS — R921 Mammographic calcification found on diagnostic imaging of breast: Secondary | ICD-10-CM

## 2020-10-13 DIAGNOSIS — R928 Other abnormal and inconclusive findings on diagnostic imaging of breast: Secondary | ICD-10-CM

## 2020-10-13 DIAGNOSIS — R922 Inconclusive mammogram: Secondary | ICD-10-CM | POA: Diagnosis not present

## 2020-11-11 ENCOUNTER — Ambulatory Visit (INDEPENDENT_AMBULATORY_CARE_PROVIDER_SITE_OTHER): Payer: BC Managed Care – PPO | Admitting: Otolaryngology

## 2020-11-11 ENCOUNTER — Other Ambulatory Visit: Payer: Self-pay

## 2020-11-11 DIAGNOSIS — H9313 Tinnitus, bilateral: Secondary | ICD-10-CM

## 2020-11-11 DIAGNOSIS — H93293 Other abnormal auditory perceptions, bilateral: Secondary | ICD-10-CM | POA: Diagnosis not present

## 2020-11-11 IMAGING — MG DIGITAL SCREENING BILAT W/ TOMO
8 series · 8 of 24 positions shown · non-contrast
Comparison: Previous exam(s).

CLINICAL DATA: Screening.

EXAM:
DIGITAL SCREENING BILATERAL MAMMOGRAM WITH TOMO AND CAD

[L CC synth-2D]
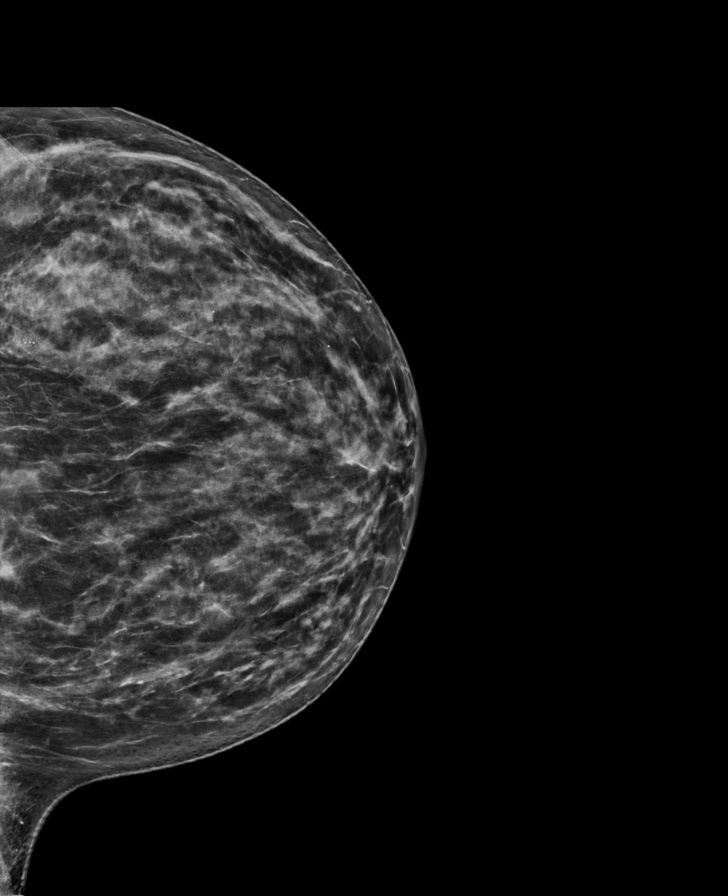

[R MLO synth-2D]
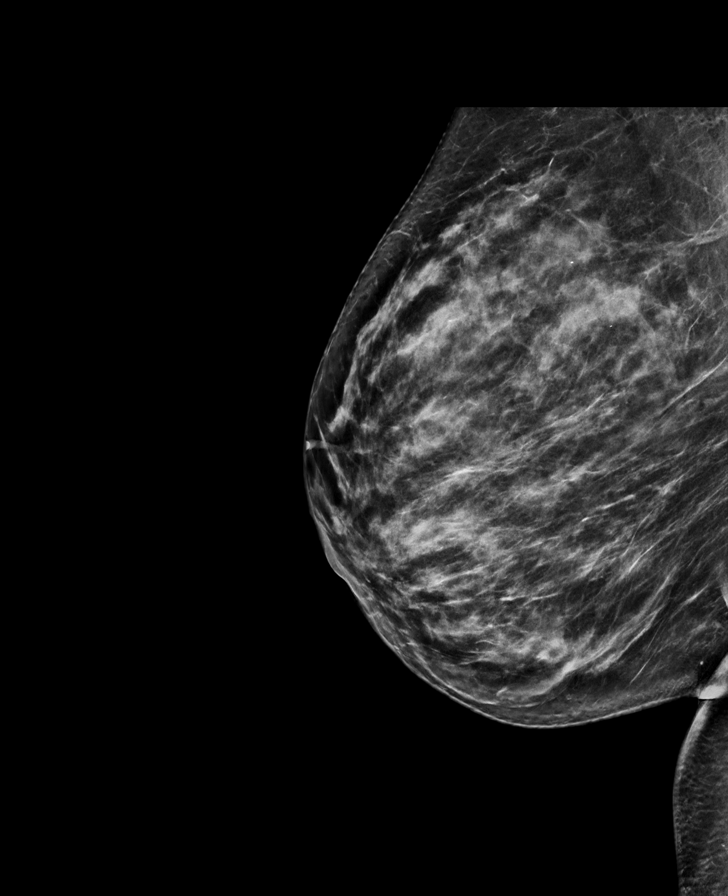

[L MLO synth-2D]
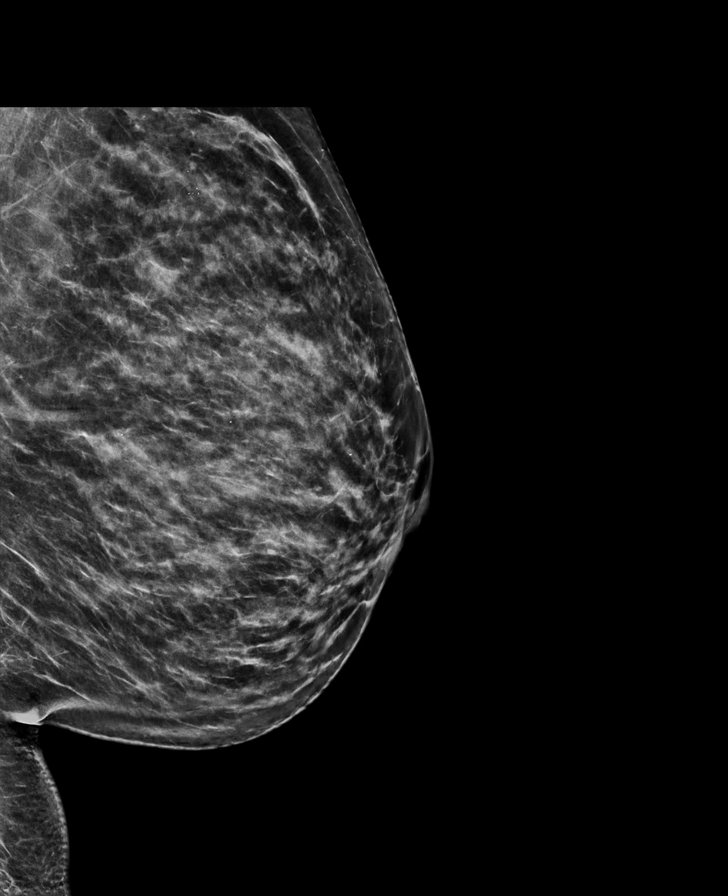

[R CC synth-2D]
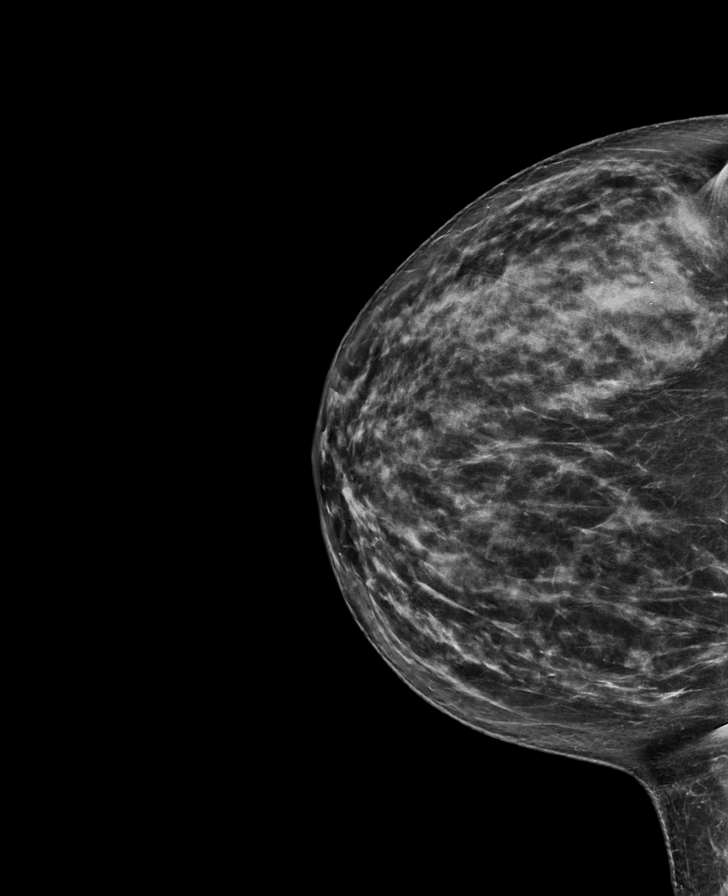

[R CC tomo · tomo slice 39/76.0]
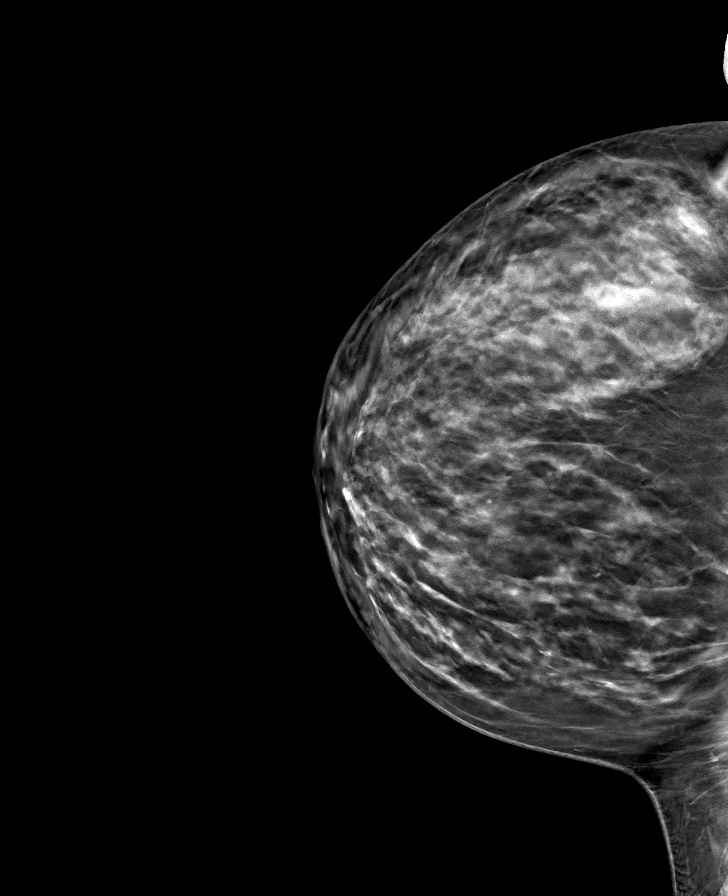

[L CC tomo · tomo slice 37/72.0]
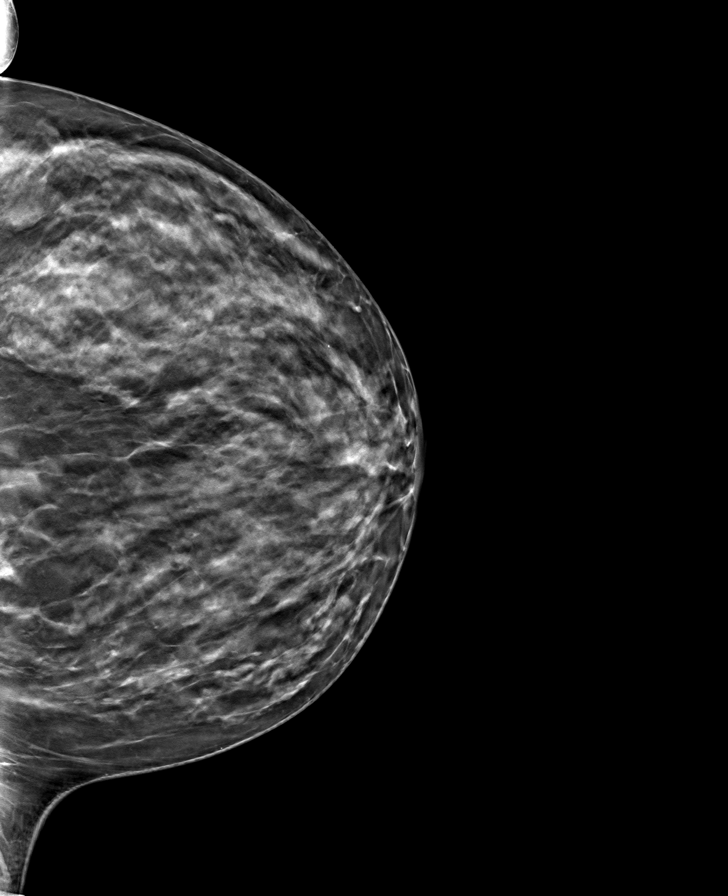

[R MLO tomo · tomo slice 38/75.0]
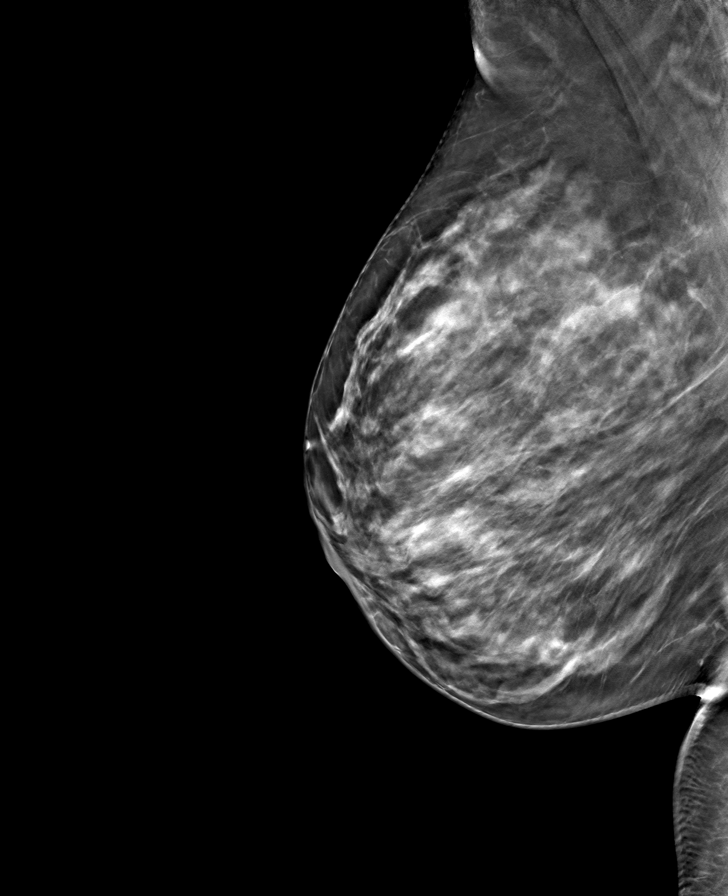

[L MLO tomo · tomo slice 35/70.0]
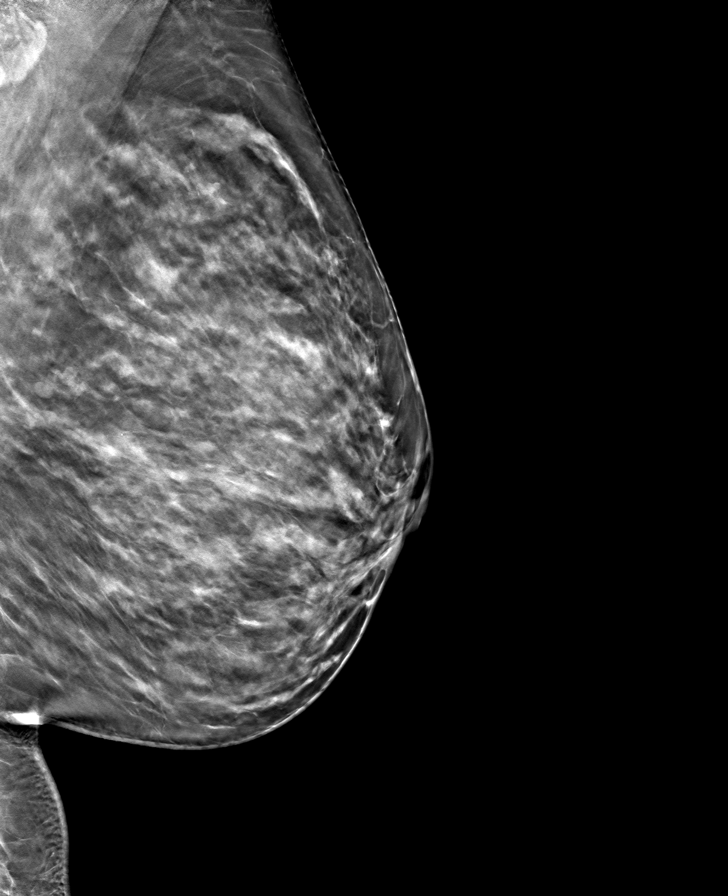

[8 of 24 positions shown; findings below may reference images not displayed]

ACR Breast Density Category c: The breast tissue is heterogeneously
dense, which may obscure small masses.
FINDINGS: In the left breast, calcifications warrant further evaluation. In
the right breast, no findings suspicious for malignancy. Images were
processed with CAD.
IMPRESSION: Further evaluation is suggested for calcifications in the left
breast.

RECOMMENDATION:
Diagnostic mammogram of the left breast. (Code:[id number])

The patient will be contacted regarding the findings, and additional
imaging will be scheduled.

BI-RADS CATEGORY  0: Incomplete. Need additional imaging evaluation
and/or prior mammograms for comparison.

## 2020-11-11 NOTE — Progress Notes (Signed)
HPI: [name] is a 50 y.o. female who presents is referred by her PCP for evaluation of ear and hearing complaints.  She stated that this initially began back in June when she developed pain in the left neck and a headache and has some distorted hearing.  Since that time she has had some chronic tinnitus in feels like the ears are stopped up at times.  She is doing better today.  She presents here for evaluation and audiologic testing..  Past Medical History:  Diagnosis Date   Acute appendicitis 2012   Family history of breast cancer    Family history of breast cancer    Family history of colon cancer    Family history of uterine cancer    Past Surgical History:  Procedure Laterality Date   APPENDECTOMY     CESAREAN SECTION  1992, 2005   LAPAROSCOPIC APPENDECTOMY  2012   Social History   Socioeconomic History   Marital status: Married    Spouse name: Not on file   Number of children: 2   Years of education: Not on file   Highest education level: Not on file  Occupational History   Occupation: Buyer, retail: [employer]  Tobacco Use   Smoking status: Never   Smokeless tobacco: Never  Vaping Use   Vaping Use: Never used  Substance and Sexual Activity   Alcohol use: No   Drug use: No   Sexual activity: Yes    Birth control/protection: I.U.D.  Other Topics Concern   Not on file  Social History Narrative   Not on file   Social Determinants of Health   Financial Resource Strain: Not on file  Food Insecurity: Not on file  Transportation Needs: Not on file  Physical Activity: Not on file  Stress: Not on file  Social Connections: Not on file   Family History  Problem Relation Age of Onset   Cancer Sister        Breast and brain   Breast cancer Sister 27   Cancer Mother        colon   Hypertension Mother    Colon cancer Mother 29   Hyperlipidemia Father    Hypertension Father    Hypothyroidism Father    Healthy Son    Alcohol abuse Maternal  Uncle    Cancer Maternal Grandmother        lung   Kidney disease Maternal Grandfather    Heart disease Paternal Grandmother    Pneumonia Paternal Grandfather    Hypothyroidism Sister    Uterine cancer Sister 50   Healthy Son    Colon cancer Paternal Uncle        d. 45   Colon polyps Neg Hx    Esophageal cancer Neg Hx    Rectal cancer Neg Hx    Stomach cancer Neg Hx    No Known Allergies Prior to Admission medications   Medication Sig Start Date End Date Taking? Authorizing Provider  Cholecalciferol (VITAMIN D3) 2000 units TABS Take 1 capsule by mouth daily.    [provider]  gabapentin (NEURONTIN) 100 MG capsule 1-2 capsules at bedtime as needed for restless leg syndrome Patient not taking: Reported on 04/15/2020 01/16/18   William Hamburger D, NP  ibuprofen (ADVIL,MOTRIN) 200 MG tablet Take 200 mg by mouth as needed.    [provider]  levonorgestrel (MIRENA) 20 MCG/24HR IUD 1 each by Intrauterine route once.    [provider]  Multiple  Vitamins-Minerals (ADULT ONE DAILY GUMMIES PO) Take 1 each by mouth daily.    [provider]  Omega-3 Fatty Acids (FISH OIL PO) Take by mouth.    [provider]  vitamin B-12 (CYANOCOBALAMIN) 1000 MCG tablet Take 1,000 mcg by mouth daily.    [provider]     Positive ROS: Otherwise negative  All other systems have been reviewed and were otherwise negative with the exception of those mentioned in the HPI and as above.  Physical Exam: Constitutional: Alert, well-appearing, no acute distress Ears: External ears without lesions or tenderness. Ear canals are clear bilaterally with intact, clear TMs bilaterally. Nasal: External nose without lesions. Septum with minimal deformity and mild rhinitis.  After decongesting the nose both middle meatus regions are clear with no signs of infection.  No polyps noted Oral: Lips and gums without lesions. Tongue and palate mucosa without lesions. Posterior  oropharynx clear. Neck: No palpable adenopathy or masses Respiratory: Breathing comfortably  Skin: No facial/neck lesions or rash noted.  Audiologic testing demonstrated normal hearing in both ears which was symmetric with SRT's of 10 dB bilaterally and type A tympanograms bilaterally.  Procedures  Assessment: Normal hearing and ear evaluation. Mild rhinitis  Plan: Discussed with her that if she has any congestion or fullness in ears would recommend use of nasal steroid spray Nasacort or Flonase which she has used in the past. Reassured her of normal hearing evaluation.   Narda Bonds, MD   CC:

## 2020-11-21 IMAGING — MG MM BREAST LOCALIZATION CLIP
4 series · 4 of 12 positions shown · non-contrast
Comparison: Previous exam(s).

CLINICAL DATA: Patient presents for biopsy of left breast
calcifications.

EXAM:
DIAGNOSTIC LEFT MAMMOGRAM POST STEREOTACTIC BIOPSY

[L ML synth-2D]
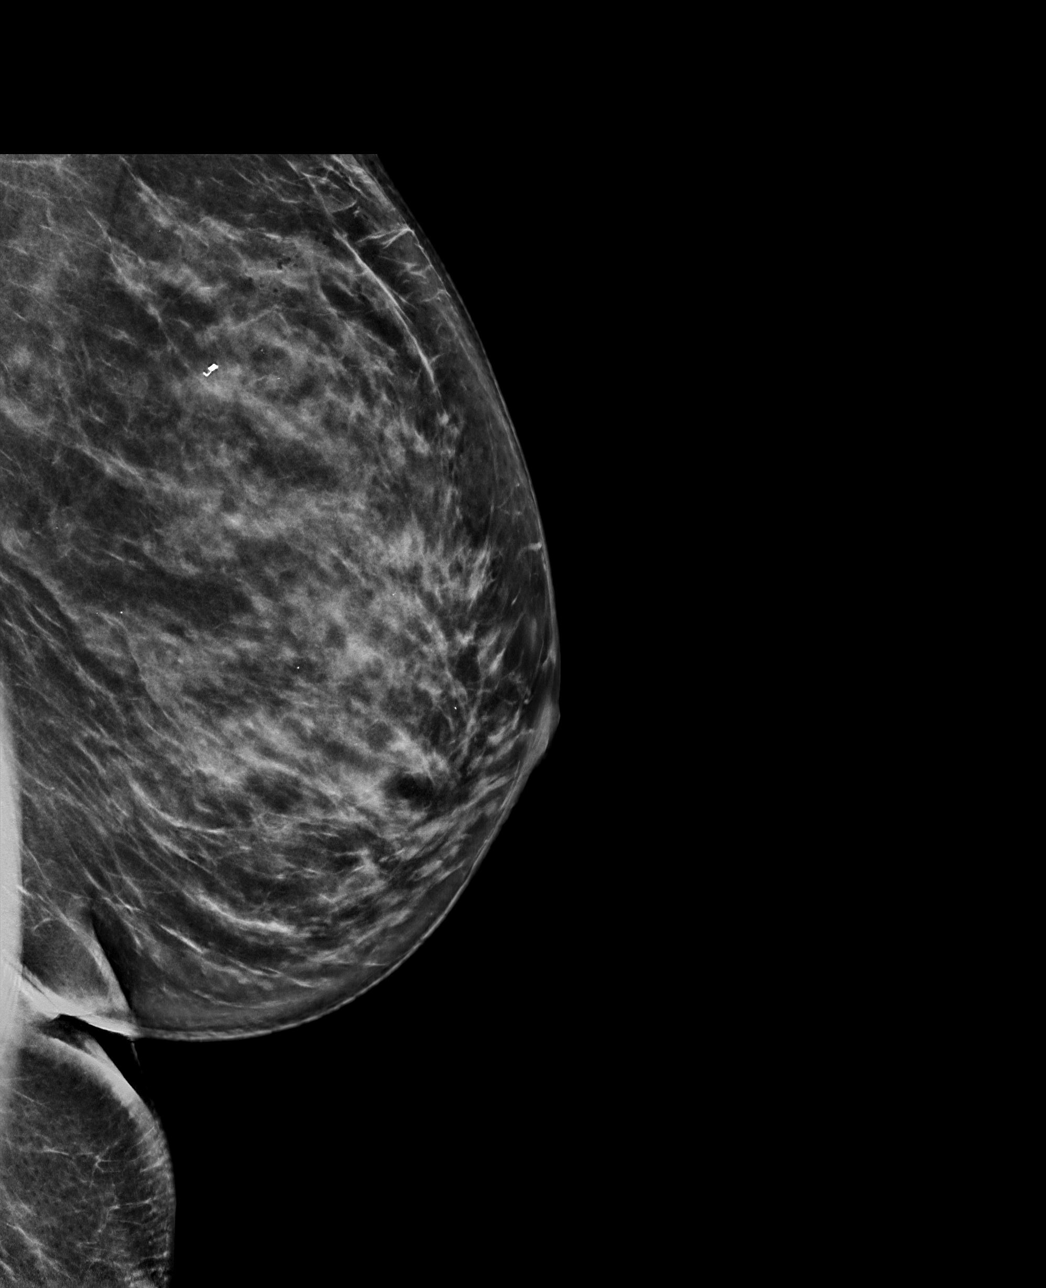

[L CC synth-2D]
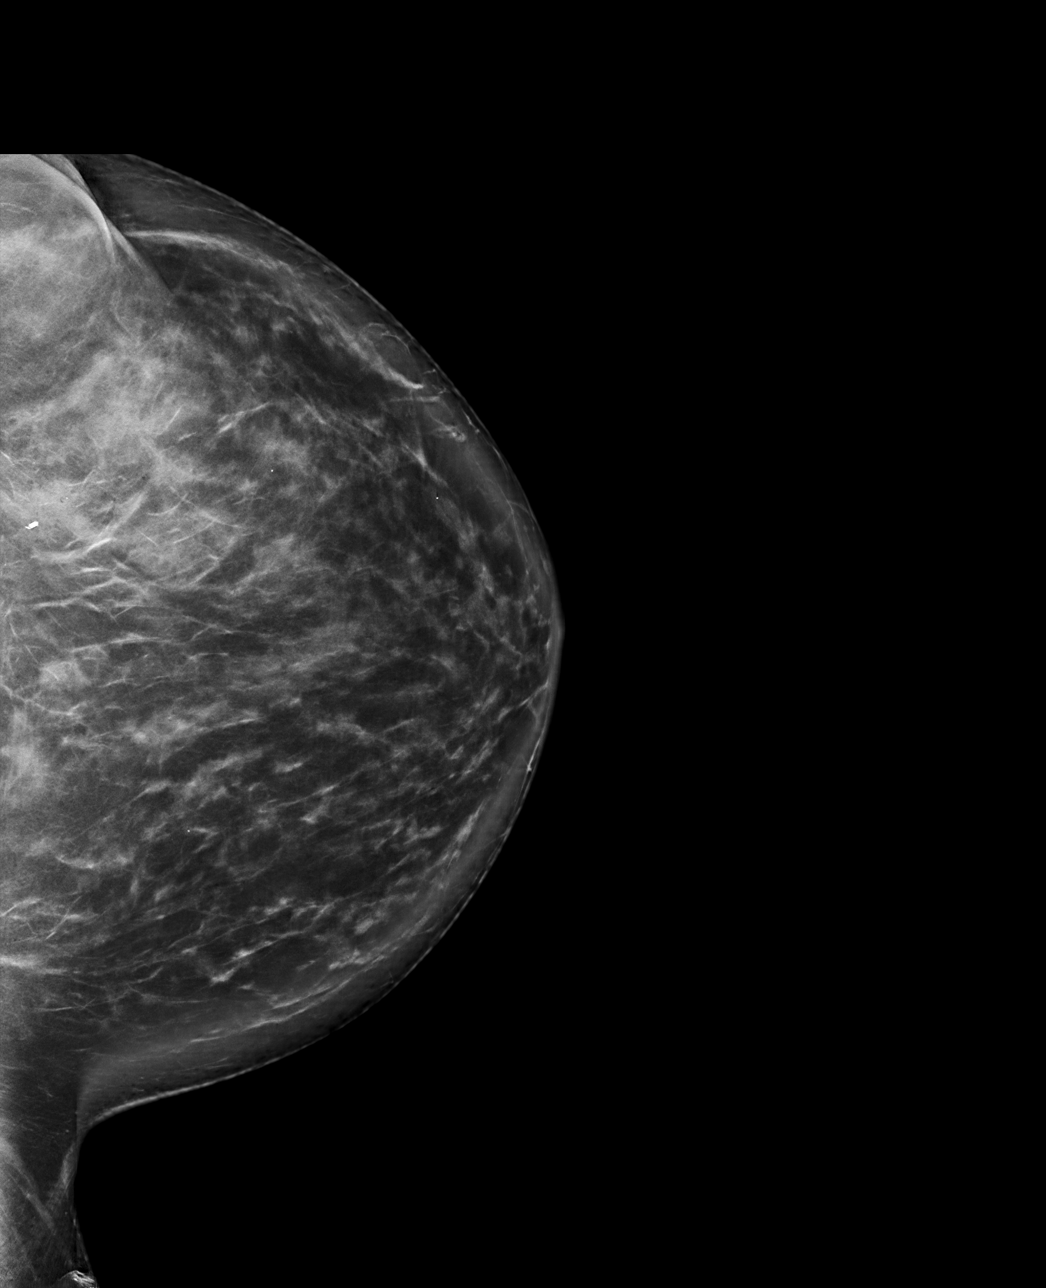

[L CC tomo · tomo slice 51/101.0]
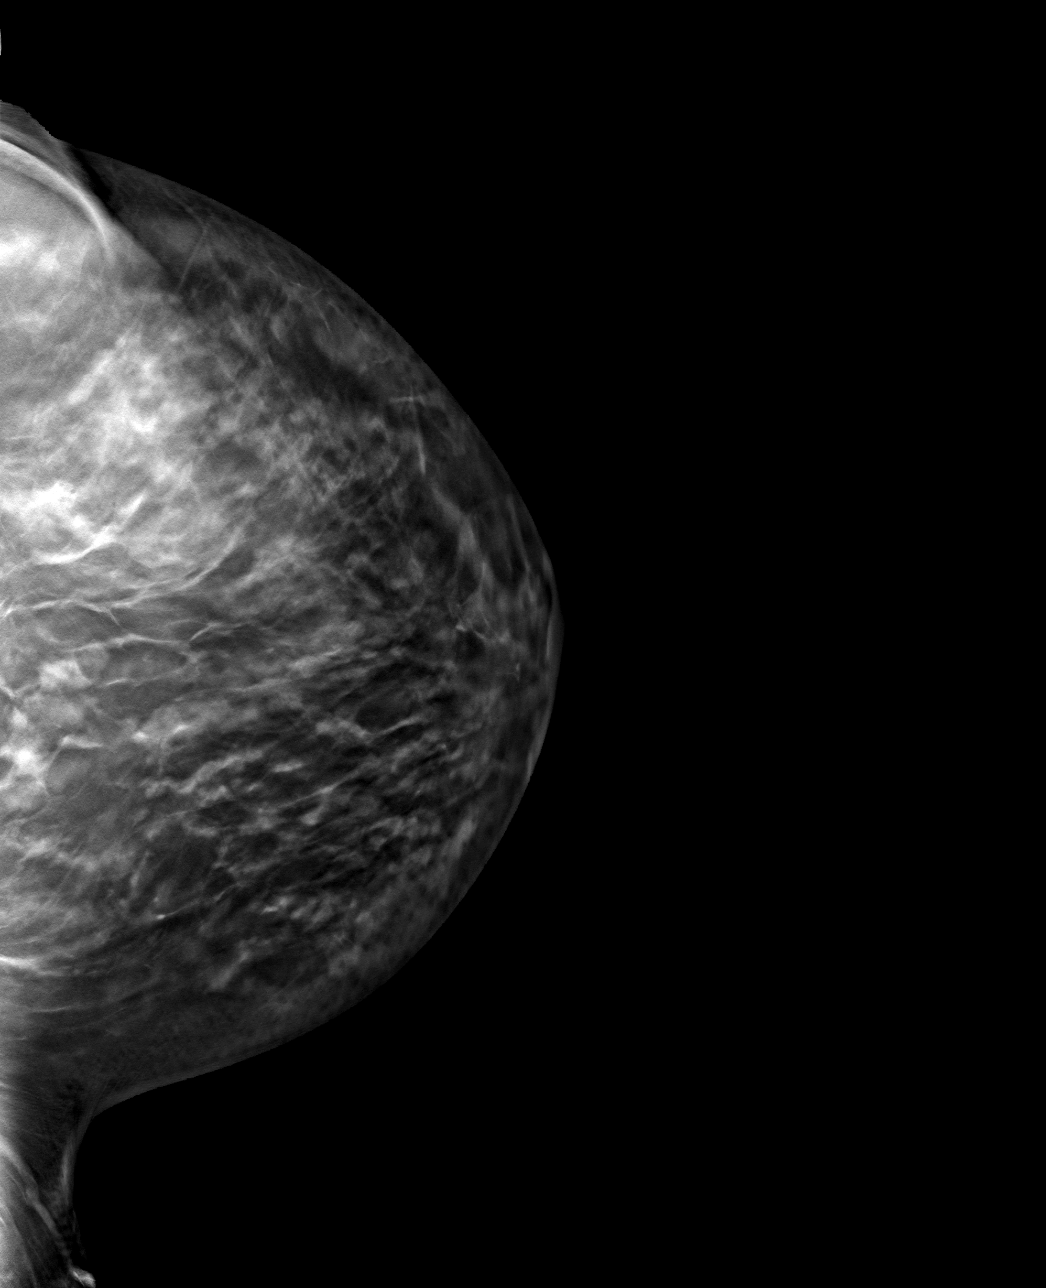

[L ML tomo · tomo slice 43/86.0]
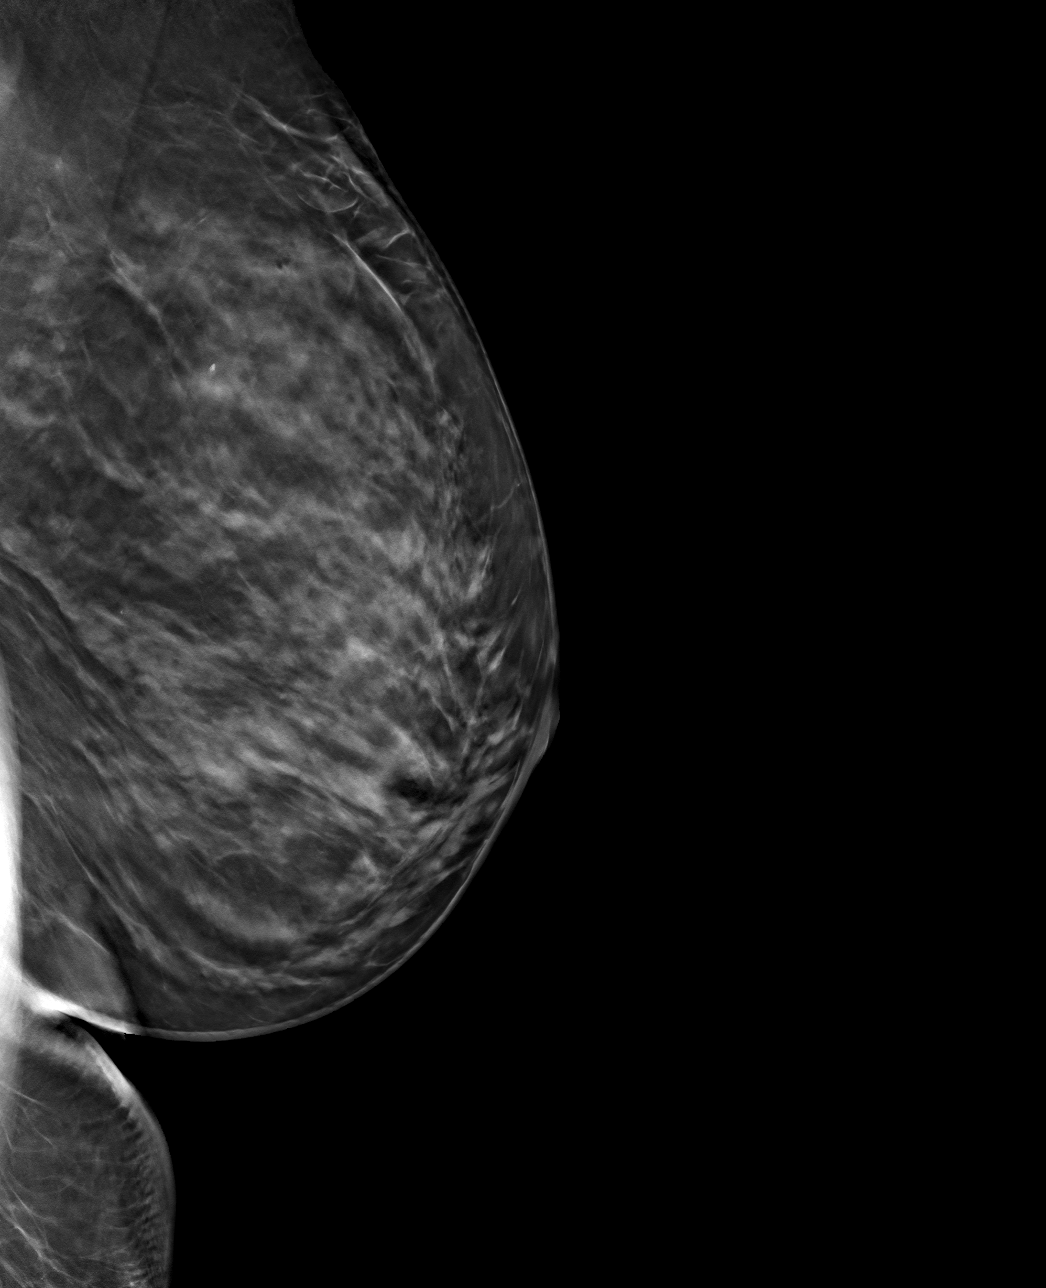

[4 of 12 positions shown; findings below may reference images not displayed]

FINDINGS: Mammographic images were obtained following stereotactic guided
biopsy of calcifications in the upper outer left breast. The biopsy
marking clip is in expected position at the site of biopsy.
IMPRESSION: Appropriate positioning of the coil shaped biopsy marking clip at
the site of biopsy in the upper-outer quadrant of the left breast.

Final Assessment: Post Procedure Mammograms for Marker Placement

## 2021-02-02 ENCOUNTER — Encounter (INDEPENDENT_AMBULATORY_CARE_PROVIDER_SITE_OTHER): Payer: Self-pay

## 2021-02-05 ENCOUNTER — Other Ambulatory Visit: Payer: Self-pay

## 2021-02-05 DIAGNOSIS — Z0000 Encounter for general adult medical examination without abnormal findings: Secondary | ICD-10-CM

## 2021-02-05 DIAGNOSIS — Z130 Encounter for screening for diseases of the blood and blood-forming organs and certain disorders involving the immune mechanism: Secondary | ICD-10-CM

## 2021-02-05 DIAGNOSIS — Z1321 Encounter for screening for nutritional disorder: Secondary | ICD-10-CM

## 2021-02-06 ENCOUNTER — Other Ambulatory Visit: Payer: Self-pay

## 2021-02-06 ENCOUNTER — Other Ambulatory Visit: Payer: BC Managed Care – PPO

## 2021-02-06 DIAGNOSIS — Z0000 Encounter for general adult medical examination without abnormal findings: Secondary | ICD-10-CM | POA: Diagnosis not present

## 2021-02-06 DIAGNOSIS — Z1329 Encounter for screening for other suspected endocrine disorder: Secondary | ICD-10-CM | POA: Diagnosis not present

## 2021-02-06 DIAGNOSIS — Z13228 Encounter for screening for other metabolic disorders: Secondary | ICD-10-CM | POA: Diagnosis not present

## 2021-02-06 DIAGNOSIS — Z130 Encounter for screening for diseases of the blood and blood-forming organs and certain disorders involving the immune mechanism: Secondary | ICD-10-CM

## 2021-02-06 DIAGNOSIS — Z1321 Encounter for screening for nutritional disorder: Secondary | ICD-10-CM | POA: Diagnosis not present

## 2021-02-07 LAB — CBC WITH DIFFERENTIAL/PLATELET
Basophils Absolute: 0 10*3/uL (ref 0.0–0.2)
Basos: 0 %
EOS (ABSOLUTE): 0.2 10*3/uL (ref 0.0–0.4)
Eos: 4 %
Hematocrit: 37.2 % (ref 34.0–46.6)
Hemoglobin: 12.6 g/dL (ref 11.1–15.9)
Immature Grans (Abs): 0 10*3/uL (ref 0.0–0.1)
Immature Granulocytes: 0 %
Lymphocytes Absolute: 1.3 10*3/uL (ref 0.7–3.1)
Lymphs: 29 %
MCH: 32.5 pg (ref 26.6–33.0)
MCHC: 33.9 g/dL (ref 31.5–35.7)
MCV: 96 fL (ref 79–97)
Monocytes Absolute: 0.3 10*3/uL (ref 0.1–0.9)
Monocytes: 6 %
Neutrophils Absolute: 2.7 10*3/uL (ref 1.4–7.0)
Neutrophils: 61 %
Platelets: 151 10*3/uL (ref 150–450)
RBC: 3.88 x10E6/uL (ref 3.77–5.28)
RDW: 12.1 % (ref 11.7–15.4)
WBC: 4.5 10*3/uL (ref 3.4–10.8)

## 2021-02-07 LAB — COMPREHENSIVE METABOLIC PANEL
ALT: 20 IU/L (ref 0–32)
AST: 18 IU/L (ref 0–40)
Albumin/Globulin Ratio: 2.3 — ABNORMAL HIGH (ref 1.2–2.2)
Albumin: 4.4 g/dL (ref 3.8–4.9)
Alkaline Phosphatase: 93 IU/L (ref 44–121)
BUN/Creatinine Ratio: 16 (ref 9–23)
BUN: 12 mg/dL (ref 6–24)
Bilirubin Total: 0.4 mg/dL (ref 0.0–1.2)
CO2: 25 mmol/L (ref 20–29)
Calcium: 9 mg/dL (ref 8.7–10.2)
Chloride: 101 mmol/L (ref 96–106)
Creatinine, Ser: 0.74 mg/dL (ref 0.57–1.00)
Globulin, Total: 1.9 g/dL (ref 1.5–4.5)
Glucose: 94 mg/dL (ref 70–99)
Potassium: 4.1 mmol/L (ref 3.5–5.2)
Sodium: 139 mmol/L (ref 134–144)
Total Protein: 6.3 g/dL (ref 6.0–8.5)
eGFR: 98 mL/min/{1.73_m2} (ref >59)

## 2021-02-07 LAB — LIPID PANEL
Chol/HDL Ratio: 2.8 ratio (ref 0.0–4.4)
Cholesterol, Total: 179 mg/dL (ref 100–199)
HDL: 64 mg/dL (ref >39)
LDL Chol Calc (NIH): 108 mg/dL — ABNORMAL HIGH (ref 0–99)
Triglycerides: 35 mg/dL (ref 0–149)
VLDL Cholesterol Cal: 7 mg/dL (ref 5–40)

## 2021-02-07 LAB — TSH: TSH: 3.25 u[IU]/mL (ref 0.450–4.500)

## 2021-02-07 LAB — HEMOGLOBIN A1C
Est. average glucose Bld gHb Est-mCnc: 117 mg/dL
Hgb A1c MFr Bld: 5.7 % — ABNORMAL HIGH (ref 4.8–5.6)

## 2021-02-10 ENCOUNTER — Other Ambulatory Visit: Payer: Self-pay

## 2021-02-10 ENCOUNTER — Ambulatory Visit (INDEPENDENT_AMBULATORY_CARE_PROVIDER_SITE_OTHER): Payer: BC Managed Care – PPO | Admitting: Physician Assistant

## 2021-02-10 ENCOUNTER — Encounter: Payer: Self-pay | Admitting: Physician Assistant

## 2021-02-10 VITALS — BP 106/65 | HR 65 | Temp 98.2°F | Ht 67.5 in | Wt 169.9 lb

## 2021-02-10 DIAGNOSIS — G2581 Restless legs syndrome: Secondary | ICD-10-CM | POA: Diagnosis not present

## 2021-02-10 DIAGNOSIS — E7800 Pure hypercholesterolemia, unspecified: Secondary | ICD-10-CM

## 2021-02-10 DIAGNOSIS — R7303 Prediabetes: Secondary | ICD-10-CM

## 2021-02-10 DIAGNOSIS — Z0000 Encounter for general adult medical examination without abnormal findings: Secondary | ICD-10-CM

## 2021-02-10 DIAGNOSIS — G4700 Insomnia, unspecified: Secondary | ICD-10-CM | POA: Diagnosis not present

## 2021-02-10 DIAGNOSIS — D696 Thrombocytopenia, unspecified: Secondary | ICD-10-CM

## 2021-02-10 MED ORDER — GABAPENTIN 100 MG PO CAPS: ORAL_CAPSULE | ORAL | 0 refills | Status: DC

## 2021-02-10 MED ORDER — TRAZODONE HCL 50 MG PO TABS: 25.0000 mg | ORAL_TABLET | Freq: Every evening | ORAL | 1 refills | Status: DC | PRN

## 2021-02-10 NOTE — Patient Instructions (Signed)
Preventive Care 51-51 Years Old, Female Preventive care refers to lifestyle choices and visits with your health care provider that can promote health and wellness. This includes: A yearly physical exam. This is also called an annual wellness visit. Regular dental and eye exams. Immunizations. Screening for certain conditions. Healthy lifestyle choices, such as: Eating a healthy diet. Getting regular exercise. Not using drugs or products that contain nicotine and tobacco. Limiting alcohol use. What can I expect for my preventive care visit? Physical exam Your health care provider will check your: Height and weight. These may be used to calculate your BMI (body mass index). BMI is a measurement that tells if you are at a healthy weight. Heart rate and blood pressure. Body temperature. Skin for abnormal spots. Counseling Your health care provider may ask you questions about your: Past medical problems. Family's medical history. Alcohol, tobacco, and drug use. Emotional well-being. Home life and relationship well-being. Sexual activity. Diet, exercise, and sleep habits. Work and work environment. Access to firearms. Method of birth control. Menstrual cycle. Pregnancy history. What immunizations do I need? Vaccines are usually given at various ages, according to a schedule. Your health care provider will recommend vaccines for you based on your age, medical history, and lifestyle or other factors, such as travel or where you work. What tests do I need? Blood tests Lipid and cholesterol levels. These may be checked every 5 years, or more often if you are over 51 years old. Hepatitis C test. Hepatitis B test. Screening Lung cancer screening. You may have this screening every year starting at age 55 if you have a 30-pack-year history of smoking and currently smoke or have quit within the past 15 years. Colorectal cancer screening. All adults should have this screening starting at  age 51 and continuing until age 75. Your health care provider may recommend screening at age 51 if you are at increased risk. You will have tests every 1-10 years, depending on your results and the type of screening test. Diabetes screening. This is done by checking your blood sugar (glucose) after you have not eaten for a while (fasting). You may have this done every 1-3 years. Mammogram. This may be done every 1-2 years. Talk with your health care provider about when you should start having regular mammograms. This may depend on whether you have a family history of breast cancer. BRCA-related cancer screening. This may be done if you have a family history of breast, ovarian, tubal, or peritoneal cancers. Pelvic exam and Pap test. This may be done every 3 years starting at age 21. Starting at age 30, this may be done every 5 years if you have a Pap test in combination with an HPV test. Other tests STD (sexually transmitted disease) testing, if you are at risk. Bone density scan. This is done to screen for osteoporosis. You may have this scan if you are at high risk for osteoporosis. Talk with your health care provider about your test results, treatment options, and if necessary, the need for more tests. Follow these instructions at home: Eating and drinking  Eat a diet that includes fresh fruits and vegetables, whole grains, lean protein, and low-fat dairy products. Take vitamin and mineral supplements as recommended by your health care provider. Do not drink alcohol if: Your health care provider tells you not to drink. You are pregnant, may be pregnant, or are planning to become pregnant. If you drink alcohol: Limit how much you have to 0-1 drink a day. Be   aware of how much alcohol is in your drink. In the [place] one drink equals one 12 oz bottle of beer (355 mL), one 5 oz glass of wine (148 mL), or one 1 oz glass of hard liquor (44 mL). Lifestyle Take daily care of your teeth and  gums. Brush your teeth every morning and night with fluoride toothpaste. Floss one time each day. Stay active. Exercise for at least 30 minutes 5 or more days each week. Do not use any products that contain nicotine or tobacco, such as cigarettes, e-cigarettes, and chewing tobacco. If you need help quitting, ask your health care provider. Do not use drugs. If you are sexually active, practice safe sex. Use a condom or other form of protection to prevent STIs (sexually transmitted infections). If you do not wish to become pregnant, use a form of birth control. If you plan to become pregnant, see your health care provider for a prepregnancy visit. If told by your health care provider, take low-dose aspirin daily starting at age 51. Find healthy ways to cope with stress, such as: Meditation, yoga, or listening to music. Journaling. Talking to a trusted person. Spending time with friends and family. Safety Always wear your seat belt while driving or riding in a vehicle. Do not drive: If you have been drinking alcohol. Do not ride with someone who has been drinking. When you are tired or distracted. While texting. Wear a helmet and other protective equipment during sports activities. If you have firearms in your house, make sure you follow all gun safety procedures. What's next? Visit your health care provider once a year for an annual wellness visit. Ask your health care provider how often you should have your eyes and teeth checked. Stay up to date on all vaccines. This information is not intended to replace advice given to you by your health care provider. Make sure you discuss any questions you have with your health care provider. Document Revised: 06/13/2020 Document Reviewed: 12/15/2017 Elsevier Patient Education  2022 [employer].

## 2021-02-10 NOTE — Progress Notes (Signed)
Subjective:     [name] is a 50 y.o. female and is here for a comprehensive physical exam. The patient reports  having trouble with sleep, has tried melatonin which sometimes works and sometimes doesn't, continues to have intermittent epigastric pain .  Social History   Socioeconomic History   Marital status: Married    Spouse name: Not on file   Number of children: 2   Years of education: Not on file   Highest education level: Not on file  Occupational History   Occupation: Buyer, retail: [employer]  Tobacco Use   Smoking status: Never   Smokeless tobacco: Never  Vaping Use   Vaping Use: Never used  Substance and Sexual Activity   Alcohol use: No   Drug use: No   Sexual activity: Yes    Birth control/protection: I.U.D.  Other Topics Concern   Not on file  Social History Narrative   Not on file   Social Determinants of Health   Financial Resource Strain: Not on file  Food Insecurity: Not on file  Transportation Needs: Not on file  Physical Activity: Not on file  Stress: Not on file  Social Connections: Not on file  Intimate Partner Violence: Not on file   Health Maintenance  Topic Date Due   COVID-19 Vaccine (1) Never done   Zoster Vaccines- Shingrix (1 of 2) Never done   PAP SMEAR-Modifier  04/28/2020   INFLUENZA VACCINE  Never done   MAMMOGRAM  03/22/2022   COLONOSCOPY (Pts 45-21yrs Insurance coverage will need to be confirmed)  04/15/2025   TETANUS/TDAP  02/06/2029   Hepatitis C Screening  Completed   HIV Screening  Completed   Pneumococcal Vaccine 52-85 Years old  Aged Out   HPV VACCINES  Aged Out    The following portions of the patient's history were reviewed and updated as appropriate: allergies, current medications, past family history, past medical history, past social history, past surgical history, and problem list.  Review of Systems Pertinent items noted in HPI and remainder of comprehensive ROS otherwise negative.    Objective:    BP 106/65   Pulse 65   Temp 98.2 F (36.8 C)   Ht 5' 7.5" (1.715 m)   Wt 169 lb 14.4 oz (77.1 kg)   SpO2 100%   BMI 26.22 kg/m  General appearance: alert, cooperative, and no distress Head: Normocephalic, without obvious abnormality, atraumatic Eyes: conjunctivae/corneas clear. PERRL, EOM's intact. Fundi benign. Ears: normal TM's and external ear canals both ears Nose: Nares normal. Septum midline. Mucosa normal. No drainage or sinus tenderness. Throat: lips, mucosa, and tongue normal; teeth and gums normal Neck: no adenopathy, no JVD, supple, symmetrical, trachea midline, and thyroid: normal to inspection and palpation Back: symmetric, no curvature. ROM normal. No CVA tenderness. Lungs: clear to auscultation bilaterally Heart: regular rate and rhythm and S1, S2 normal Abdomen: soft, non-tender; bowel sounds normal; no masses,  no organomegaly Extremities: extremities normal, atraumatic, no cyanosis or edema Pulses: 2+ and symmetric Skin: Skin color, texture, turgor normal. No rashes or lesions Lymph nodes: Cervical adenopathy: normal and Supraclavicular adenopathy: normal Neurologic: Grossly normal    Assessment:    Healthy female exam.     Plan:  -Declined shingles and influenza vaccine. -Will request most recent pap from [facility] -Discussed most recent labs which are essentially within normal limits or stable from prior with the exception of lipid panel and A1c. Platelet count normal. Discussed low fat and carbohydrate  diet. Encourage to continue with gradually increasing physical activity with treadmill with ultimate goal of 150 minutes/wk. -The 10-year ASCVD risk score (Arnett DK, et al., 2019) is: 0.7%   Values used to calculate the score:     Age: 84 years     Sex: Female     Is Non-Hispanic African American: No     Diabetic: No     Tobacco smoker: No     Systolic Blood Pressure: 106 mmHg     Is BP treated: No     HDL Cholesterol: 64  mg/dL     Total Cholesterol: 179 mg/dL -Continue current medication regimen. Provided refills. -Discussed good sleep hygiene, recommend to take trazodone as needed for sleep. -Advised to follow up if intermittent epigastric pain fails to improve or worsen. -Follow up in 1 year for CPE and FBW.    See After Visit Summary for Counseling Recommendations

## 2021-03-17 ENCOUNTER — Ambulatory Visit
Admission: RE | Admit: 2021-03-17 | Discharge: 2021-03-17 | Disposition: A | Discharge status: Home / Self Care | Payer: BC Managed Care – PPO | Source: Ambulatory Visit | Attending: Physician Assistant | Admitting: Physician Assistant

## 2021-03-17 DIAGNOSIS — R922 Inconclusive mammogram: Secondary | ICD-10-CM | POA: Diagnosis not present

## 2021-03-17 DIAGNOSIS — R921 Mammographic calcification found on diagnostic imaging of breast: Secondary | ICD-10-CM | POA: Diagnosis not present

## 2021-04-29 ENCOUNTER — Other Ambulatory Visit: Payer: Self-pay | Admitting: Physician Assistant

## 2021-04-29 DIAGNOSIS — G2581 Restless legs syndrome: Secondary | ICD-10-CM

## 2021-05-08 ENCOUNTER — Encounter: Payer: Self-pay | Admitting: Physician Assistant

## 2021-05-12 ENCOUNTER — Other Ambulatory Visit: Payer: Self-pay | Admitting: Physician Assistant

## 2021-05-12 DIAGNOSIS — G4700 Insomnia, unspecified: Secondary | ICD-10-CM

## 2021-07-06 ENCOUNTER — Encounter: Payer: Self-pay | Admitting: Physician Assistant

## 2021-07-14 ENCOUNTER — Encounter: Payer: Self-pay | Admitting: Physician Assistant

## 2021-07-15 ENCOUNTER — Other Ambulatory Visit: Payer: Self-pay

## 2021-07-15 DIAGNOSIS — R1012 Left upper quadrant pain: Secondary | ICD-10-CM

## 2021-07-15 DIAGNOSIS — R630 Anorexia: Secondary | ICD-10-CM

## 2021-07-15 DIAGNOSIS — R112 Nausea with vomiting, unspecified: Secondary | ICD-10-CM

## 2021-07-22 ENCOUNTER — Ambulatory Visit (INDEPENDENT_AMBULATORY_CARE_PROVIDER_SITE_OTHER): Payer: BC Managed Care – PPO | Admitting: Gastroenterology

## 2021-07-22 ENCOUNTER — Encounter: Payer: Self-pay | Admitting: Gastroenterology

## 2021-07-22 ENCOUNTER — Other Ambulatory Visit (INDEPENDENT_AMBULATORY_CARE_PROVIDER_SITE_OTHER): Payer: BC Managed Care – PPO

## 2021-07-22 VITALS — BP 116/70 | HR 79 | Wt 168.0 lb

## 2021-07-22 DIAGNOSIS — R1013 Epigastric pain: Secondary | ICD-10-CM

## 2021-07-22 DIAGNOSIS — K828 Other specified diseases of gallbladder: Secondary | ICD-10-CM

## 2021-07-22 LAB — BASIC METABOLIC PANEL
BUN: 17 mg/dL (ref 6–23)
CO2: 30 mEq/L (ref 19–32)
Calcium: 9.5 mg/dL (ref 8.4–10.5)
Chloride: 103 mEq/L (ref 96–112)
Creatinine, Ser: 0.79 mg/dL (ref 0.40–1.20)
GFR: 86.18 mL/min (ref >60.00)
Glucose, Bld: 97 mg/dL (ref 70–99)
Potassium: 3.9 mEq/L (ref 3.5–5.1)
Sodium: 141 mEq/L (ref 135–145)

## 2021-07-22 LAB — CBC
HCT: 37.3 % (ref 36.0–46.0)
Hemoglobin: 12.7 g/dL (ref 12.0–15.0)
MCHC: 34.1 g/dL (ref 30.0–36.0)
MCV: 94.7 fl (ref 78.0–100.0)
Platelets: 170 10*3/uL (ref 150.0–400.0)
RBC: 3.94 Mil/uL (ref 3.87–5.11)
RDW: 13.6 % (ref 11.5–15.5)
WBC: 5 10*3/uL (ref 4.0–10.5)

## 2021-07-22 NOTE — Progress Notes (Signed)
Review of pertinent gastrointestinal problems: ?1.  Family history of colon cancer.  Mother diagnosed in her 19s.  Colonoscopy 2021 found no polyps.  She was recommended to have repeat colon cancer screening in 5-year interval. ? ? ?HPI: ?This is a very pleasant 51 year old woman who was referred to me by Mayer Masker, PA-C  to evaluate epigastric.   ? ?I last saw the time of a colonoscopy about 2 years ago.  She is here today to discuss a new problem. ? ?For about the past 2 years she has had intermittent recurrent epigastric pain that radiates around to the right side.  This occurs daily.  She often notices when she is driving but never when just riding as a passenger in her car.  When she feels this pain will make her slightly nauseous.  It can be quite sharp at times and when it is it only lasts for a few seconds.  Eating does not bring it on, moving her bowels does not bring it on or make it better.  She does not take any NSAIDs.  Her weight fluctuates around generally same range. ? ?Old Data Reviewed: ? ?Blood work 01/2021 CBC and complete metabolic profile were both normal ?Abdominal ultrasound "complete" 02/2020 indication "epigastric pain" findings "no acute process" gallbladder was described as being normal. ? ? ? ?Review of systems: ?Pertinent positive and negative review of systems were noted in the above HPI section. All other review negative. ? ? ?Past Medical History:  ?Diagnosis Date  ? Acute appendicitis 2012  ? Family history of breast cancer   ? Family history of breast cancer   ? Family history of colon cancer   ? Family history of uterine cancer   ? ? ?Past Surgical History:  ?Procedure Laterality Date  ? APPENDECTOMY    ? CESAREAN SECTION  1992, 2005  ? LAPAROSCOPIC APPENDECTOMY  2012  ? ? ?Current Outpatient Medications  ?Medication Instructions  ? Cholecalciferol (VITAMIN D3) 2000 units TABS 1 capsule, Oral, Daily  ? gabapentin (NEURONTIN) 100 MG capsule TAKE 1 OR 2 CAPSULES BY MOUTH EVERY  NIGHT AT BEDTIME AS NEEDED RESTLESS LEG SYNDROME  ? ibuprofen (ADVIL) 200 mg, As needed  ? levonorgestrel (MIRENA) 20 MCG/24HR IUD 1 each, Intrauterine,  Once  ? Multiple Vitamins-Minerals (ADULT ONE DAILY GUMMIES PO) 1 each, Oral, Daily  ? Omega-3 Fatty Acids (FISH OIL PO) Oral  ? traZODone (DESYREL) 50 MG tablet TAKE 1/2 TO 1 TABLET(25 TO 50 MG) BY MOUTH AT BEDTIME AS NEEDED FOR SLEEP  ? vitamin B-12 (CYANOCOBALAMIN) 1,000 mcg, Daily  ? ? ?Allergies as of 07/22/2021  ? (No Known Allergies)  ? ? ?Family History  ?Problem Relation Age of Onset  ? Cancer Sister   ?     Breast and brain  ? Breast cancer Sister 58  ? Cancer Mother   ?     colon  ? Hypertension Mother   ? Colon cancer Mother 36  ? Hyperlipidemia Father   ? Hypertension Father   ? Hypothyroidism Father   ? Healthy Son   ? Alcohol abuse Maternal Uncle   ? Cancer Maternal Grandmother   ?     lung  ? Kidney disease Maternal Grandfather   ? Heart disease Paternal Grandmother   ? Pneumonia Paternal Grandfather   ? Hypothyroidism Sister   ? Uterine cancer Sister 29  ? Healthy Son   ? Colon cancer Paternal Uncle   ?     d. 45  ?  Colon polyps Neg Hx   ? Esophageal cancer Neg Hx   ? Rectal cancer Neg Hx   ? Stomach cancer Neg Hx   ? ? ?Social History  ? ?Socioeconomic History  ? Marital status: Married  ?  Spouse name: Not on file  ? Number of children: 2  ? Years of education: Not on file  ? Highest education level: Not on file  ?Occupational History  ? Occupation: Physiological scientist  ?  Employer: [employer]  ?Tobacco Use  ? Smoking status: Never  ? Smokeless tobacco: Never  ?Vaping Use  ? Vaping Use: Never used  ?Substance and Sexual Activity  ? Alcohol use: No  ? Drug use: No  ? Sexual activity: Yes  ?  Birth control/protection: I.U.D.  ?Other Topics Concern  ? Not on file  ?Social History Narrative  ? Not on file  ? ?Social Determinants of Health  ? ?Financial Resource Strain: Not on file  ?Food Insecurity: Not on file  ?Transportation Needs: Not on file   ?Physical Activity: Not on file  ?Stress: Not on file  ?Social Connections: Not on file  ?Intimate Partner Violence: Not on file  ? ? ? ?Physical Exam: ?BP 116/70   Pulse 79   Wt 168 lb (76.2 kg)   SpO2 98%   BMI 25.92 kg/m?  ?Constitutional: generally well-appearing ?Psychiatric: alert and oriented x3 ?Eyes: extraocular movements intact ?Mouth: oral pharynx moist, no lesions ?Neck: supple no lymphadenopathy ?Cardiovascular: heart regular rate and rhythm ?Lungs: clear to auscultation bilaterally ?Abdomen: soft, nontender, nondistended, no obvious ascites, no peritoneal signs, normal bowel sounds ?Extremities: no lower extremity edema bilaterally ?Skin: no lesions on visible extremities ? ? ?Assessment and plan: ?51 y.o. female with intermittent epigastric pains that radiate around the right side to her back ? ?Her pains might be biliary.  She did have an ultrasound of her abdomen about 2 years ago and showed that her gallbladder appeared normal without any gallstones.  I am going to start her work-up today with a HIDA scan with CCK to see if she has biliary dyskinesia.  She will also get basic set of labs including CBC, complete metabolic profile.  She understands if this testing is not helpful that she might need further testing at which point would probably recommend an EGD. ? ? ? ?Please see the "Patient Instructions" section for addition details about the plan. ? ? ?Rob Bunting, MD ?[facility] ?07/22/2021, 1:50 PM ? ?Cc: Mayer Masker, PA-C ? ?Total time on date of encounter was 45  minutes (this included time spent preparing to see the patient reviewing records; obtaining and/or reviewing separately obtained history; performing a medically appropriate exam and/or evaluation; counseling and educating the patient and family if present; ordering medications, tests or procedures if applicable; and documenting clinical information in the health record). ? ? ?

## 2021-07-22 NOTE — Patient Instructions (Signed)
If you are age 52 or younger, your body mass index should be between 19-25. Your Body mass index is 25.92 kg/m?Marland Kitchen If this is out of the aformentioned range listed, please consider follow up with your Primary Care Provider.  ?________________________________________________________ ? ?The [facility] providers would like to encourage you to use [facility] to communicate with providers for non-urgent requests or questions.  Due to long hold times on the telephone, sending your provider a message by [facility] may be a faster and more efficient way to get a response.  Please allow 48 business hours for a response.  Please remember that this is for non-urgent requests.  ?_______________________________________________________ ? ?Your provider has requested that you go to the basement level for lab work before leaving today. Press "B" on the elevator. The lab is located at the first door on the left as you exit the elevator. ? ?You have been scheduled for a HIDA scan at [facility] (1st floor) on 08-04-21. Please arrive 307 minutes prior to your scheduled appointment at  6:31SH. Make certain not to have anything to eat or drink at least 6 hours prior to your test. Should this appointment date or time not work well for you, please call radiology scheduling at [phone].  ?_____________________________________________________________________ ?Hepatobiliary (HIDA) scan is an imaging procedure used to diagnose problems in the liver, gallbladder and bile ducts. In the HIDA scan, a radioactive chemical or tracer is injected into a vein in your arm. The tracer is handled by the liver like bile. Bile is a fluid produced and excreted by your liver that helps your digestive system break down fats in the foods you eat. Bile is stored in your gallbladder and the gallbladder releases the bile when you eat a meal. A special nuclear medicine scanner (gamma camera) tracks the flow of the tracer from your liver into your gallbladder  and small intestine.  ? ?During your HIDA scan  ?You'll be asked to change into a hospital gown before your HIDA scan begins. Your health care team will position you on a table, usually on your back. The radioactive tracer is then injected into a vein in your arm.The tracer travels through your bloodstream to your liver, where it's taken up by the bile-producing cells. The radioactive tracer travels with the bile from your liver into your gallbladder and through your bile ducts to your small intestine.You may feel some pressure while the radioactive tracer is injected into your vein. As you lie on the table, a special gamma camera is positioned over your abdomen taking pictures of the tracer as it moves through your body. The gamma camera takes pictures continually for about an hour. You'll need to keep still during the HIDA scan. This can become uncomfortable, but you may find that you can lessen the discomfort by taking deep breaths and thinking about other things. Tell your health care team if you're uncomfortable. The radiologist will watch on a computer the progress of the radioactive tracer through your body. The HIDA scan may be stopped when the radioactive tracer is seen in the gallbladder and enters your small intestine. This typically takes about an hour. In some cases extra imaging will be performed if original images aren't satisfactory, if morphine is given to help visualize the gallbladder or if the medication CCK is given to look at the contraction of the gallbladder. This test typically takes 2 hours to complete. ?________________________________________________________________________ ? ? ?Due to recent changes in healthcare laws, you may see the results  of your imaging and laboratory studies on MyChart before your provider has had a chance to review them.  We understand that in some cases there may be results that are confusing or concerning to you. Not all laboratory results come back in the same  time frame and the provider may be waiting for multiple results in order to interpret others.  Please give [place] 48 hours in order for your provider to thoroughly review all the results before contacting the office for clarification of your results.  ? ?Thank you for entrusting me with your care and choosing [facility]. ? ?Dr Christella Hartigan ? ?

## 2021-08-04 ENCOUNTER — Ambulatory Visit (HOSPITAL_COMMUNITY)
Admission: RE | Admit: 2021-08-04 | Discharge: 2021-08-04 | Disposition: A | Discharge status: Home / Self Care | Payer: BC Managed Care – PPO | Source: Ambulatory Visit | Attending: Gastroenterology | Admitting: Gastroenterology

## 2021-08-04 DIAGNOSIS — K828 Other specified diseases of gallbladder: Secondary | ICD-10-CM | POA: Diagnosis not present

## 2021-08-04 DIAGNOSIS — R1013 Epigastric pain: Secondary | ICD-10-CM | POA: Insufficient documentation

## 2021-08-04 DIAGNOSIS — R109 Unspecified abdominal pain: Secondary | ICD-10-CM | POA: Diagnosis not present

## 2021-08-04 MED ORDER — TECHNETIUM TC 99M MEBROFENIN IV KIT
5.1000 | PACK | Freq: Once | INTRAVENOUS | Status: AC | PRN
  Administered 2021-08-04: 5.1 via INTRAVENOUS

## 2021-08-13 ENCOUNTER — Encounter: Payer: Self-pay | Admitting: Physician Assistant

## 2021-09-07 ENCOUNTER — Other Ambulatory Visit: Payer: Self-pay

## 2021-09-07 ENCOUNTER — Ambulatory Visit
Admission: EM | Admit: 2021-09-07 | Discharge: 2021-09-07 | Disposition: A | Discharge status: Home / Self Care | Payer: BC Managed Care – PPO | Attending: Family Medicine | Admitting: Family Medicine

## 2021-09-07 DIAGNOSIS — J069 Acute upper respiratory infection, unspecified: Secondary | ICD-10-CM | POA: Diagnosis not present

## 2021-09-07 MED ORDER — FLUTICASONE PROPIONATE 50 MCG/ACT NA SUSP: 1.0000 | Freq: Two times a day (BID) | NASAL | 2 refills | Status: DC

## 2021-09-07 MED ORDER — PROMETHAZINE-DM 6.25-15 MG/5ML PO SYRP: 5.0000 mL | ORAL_SOLUTION | Freq: Four times a day (QID) | ORAL | 0 refills | Status: DC | PRN

## 2021-09-07 NOTE — ED Triage Notes (Signed)
Pt presents with non productive cough, sore throat, and bilateral ear pain for about a week.

## 2021-09-07 NOTE — ED Provider Notes (Signed)
[name]    CSN: [id number] Arrival date & time: 09/07/21  3846      History   Chief Complaint Chief Complaint  Patient presents with   URI    HPI [name] is a 51 y.o. female.   Presenting today with a 1 week history of dry cough, scratchy throat, bilateral ear pain and pressure.  Denies fever, chills, chest pain, shortness of breath, abdominal pain, nausea vomiting or diarrhea.  Not trying anything over-the-counter for symptoms.  No known pertinent chronic medical problems.  No known sick contacts recently.   Past Medical History:  Diagnosis Date   Acute appendicitis 2012   Family history of breast cancer    Family history of breast cancer    Family history of colon cancer    Family history of uterine cancer     Patient Active Problem List   Diagnosis Date Noted   Genetic testing 05/05/2020   Family history of breast cancer    Family history of colon cancer    Family history of uterine cancer    Thrombocytopenia (HCC) 02/29/2020   Chronic bilateral thoracic back pain 05/30/2018   Epigastric pain 05/30/2018   Restless legs syndrome (RLS) 01/16/2018   TMJ (temporomandibular joint syndrome) 08/24/2017   Healthcare maintenance 12/15/2016   PAC (premature atrial contraction) 07/12/2011   DIZZINESS 12/03/2008   TIREDNESS 12/03/2008   PALPITATIONS 12/03/2008    Past Surgical History:  Procedure Laterality Date   APPENDECTOMY     CESAREAN SECTION  1992, 2005   LAPAROSCOPIC APPENDECTOMY  2012    OB History   No obstetric history on file.      Home Medications    Prior to Admission medications   Medication Sig Start Date End Date Taking? Authorizing Provider  fluticasone (FLONASE) 50 MCG/ACT nasal spray Place 1 spray into both nostrils 2 (two) times daily. 09/07/21  Yes Particia Nearing, PA-C  promethazine-dextromethorphan (PROMETHAZINE-DM) 6.25-15 MG/5ML syrup Take 5 mLs by mouth 4 (four) times daily as needed for cough. 09/07/21   Yes Particia Nearing, PA-C  Cholecalciferol (VITAMIN D3) 2000 units TABS Take 1 capsule by mouth daily.    [provider]  gabapentin (NEURONTIN) 100 MG capsule TAKE 1 OR 2 CAPSULES BY MOUTH EVERY NIGHT AT BEDTIME AS NEEDED RESTLESS LEG SYNDROME 04/29/21   Mayer Masker, PA-C  ibuprofen (ADVIL,MOTRIN) 200 MG tablet Take 200 mg by mouth as needed.    [provider]  levonorgestrel (MIRENA) 20 MCG/24HR IUD 1 each by Intrauterine route once.    [provider]  Multiple Vitamins-Minerals (ADULT ONE DAILY GUMMIES PO) Take 1 each by mouth daily.    [provider]  Omega-3 Fatty Acids (FISH OIL PO) Take by mouth.    [provider]  traZODone (DESYREL) 50 MG tablet TAKE 1/2 TO 1 TABLET(25 TO 50 MG) BY MOUTH AT BEDTIME AS NEEDED FOR SLEEP 05/12/21   Abonza, Maritza, PA-C  vitamin B-12 (CYANOCOBALAMIN) 1000 MCG tablet Take 1,000 mcg by mouth daily.    [provider]    Family History Family History  Problem Relation Age of Onset   Cancer Sister        Breast and brain   Breast cancer Sister 54   Cancer Mother        colon   Hypertension Mother    Colon cancer Mother 60   Hyperlipidemia Father    Hypertension Father    Hypothyroidism Father    Healthy Son  Alcohol abuse Maternal Uncle    Cancer Maternal Grandmother        lung   Kidney disease Maternal Grandfather    Heart disease Paternal Grandmother    Pneumonia Paternal Grandfather    Hypothyroidism Sister    Uterine cancer Sister [id number]   Healthy Son    Colon cancer Paternal Uncle        d. 3342   Colon polyps Neg Hx    Esophageal cancer Neg Hx    Rectal cancer Neg Hx    Stomach cancer Neg Hx     Social History Social History   Tobacco Use   Smoking status: Never   Smokeless tobacco: Never  Vaping Use   Vaping Use: Never used  Substance Use Topics   Alcohol use: No   Drug use: No     Allergies   Patient has no known allergies.   Review of  Systems Review of Systems Per HPI  Physical Exam Triage Vital Signs ED Triage Vitals  Enc Vitals Group     BP 09/07/21 0829 103/63     Pulse Rate 09/07/21 0829 66     Resp 09/07/21 0829 17     Temp 09/07/21 0829 97.9 F (36.6 C)     Temp Source 09/07/21 0829 Oral     SpO2 09/07/21 0829 98 %     Weight --      Height --      Head Circumference --      Peak Flow --      Pain Score 09/07/21 0827 6     Pain Loc --      Pain Edu? --      Excl. in GC? --    No data found.  Updated Vital Signs BP 103/63 (BP Location: Left Arm)   Pulse 66   Temp 97.9 F (36.6 C) (Oral)   Resp 17   SpO2 98%   Visual Acuity Right Eye Distance:   Left Eye Distance:   Bilateral Distance:    Right Eye Near:   Left Eye Near:    Bilateral Near:     Physical Exam Vitals and nursing note reviewed.  Constitutional:      Appearance: Normal appearance.  HENT:     Head: Atraumatic.     Right Ear: Tympanic membrane and external ear normal.     Left Ear: Tympanic membrane and external ear normal.     Nose: Rhinorrhea present.     Mouth/Throat:     Mouth: Mucous membranes are moist.     Pharynx: Posterior oropharyngeal erythema present.  Eyes:     Extraocular Movements: Extraocular movements intact.     Conjunctiva/sclera: Conjunctivae normal.  Cardiovascular:     Rate and Rhythm: Normal rate and regular rhythm.     Heart sounds: Normal heart sounds.  Pulmonary:     Effort: Pulmonary effort is normal.     Breath sounds: Normal breath sounds. No wheezing or rales.  Musculoskeletal:        General: Normal range of motion.     Cervical back: Normal range of motion and neck supple.  Lymphadenopathy:     Cervical: No cervical adenopathy.  Skin:    General: Skin is warm and dry.  Neurological:     Mental Status: She is alert and oriented to person, place, and time.  Psychiatric:        Mood and Affect: Mood normal.        Thought Content: Thought  content normal.     UC Treatments /  Results  Labs (all labs ordered are listed, but only abnormal results are displayed) Labs Reviewed - No data to display  EKG   Radiology No results found.  Procedures Procedures (including critical care time)  Medications Ordered in UC Medications - No data to display  Initial Impression / Assessment and Plan / UC Course  I have reviewed the triage vital signs and the nursing notes.  Pertinent labs & imaging results that were available during my care of the patient were reviewed by me and considered in my medical decision making (see chart for details).     Suspect viral upper respiratory infection, vital signs and exam overall reassuring today, given duration will forego viral swab today.  Treat with Phenergan DM, Flonase and supportive the common occasions and home care.  Return for worsening symptoms.  Final Clinical Impressions(s) / UC Diagnoses   Final diagnoses:  Viral URI with cough   Discharge Instructions   None    ED Prescriptions     Medication Sig Dispense Auth. Provider   promethazine-dextromethorphan (PROMETHAZINE-DM) 6.25-15 MG/5ML syrup Take 5 mLs by mouth 4 (four) times daily as needed for cough. 118 mL Particia Nearing, PA-C   fluticasone [facility]) 50 MCG/ACT nasal spray Place 1 spray into both nostrils 2 (two) times daily. 18 g Particia Nearing, [place]      PDMP not reviewed this encounter.   Roosvelt Maser [place], [place] 09/07/21 [phone]

## 2021-10-06 ENCOUNTER — Ambulatory Visit (AMBULATORY_SURGERY_CENTER): Payer: BC Managed Care – PPO | Admitting: *Deleted

## 2021-10-06 VITALS — Ht 67.5 in | Wt 168.0 lb

## 2021-10-06 DIAGNOSIS — R1013 Epigastric pain: Secondary | ICD-10-CM

## 2021-10-06 NOTE — Progress Notes (Signed)
Patient's pre-visit was done today over the phone with the patient. Name,DOB and address verified. Patient denies any allergies to Eggs and Soy. Patient denies any problems with anesthesia/sedation. Patient is not taking any diet pills or blood thinners. No home Oxygen. Insurance confirmed with patient. Pt denies any changes in chart hx since last GI OV.  Prep instructions sent to pt's MyChart -pt is aware. Patient understands to call us back with any questions or concerns. Patient is aware of our care-partner policy.   EMMI education assigned to the patient for the procedure, sent to MyChart.

## 2021-10-09 ENCOUNTER — Encounter: Payer: Self-pay | Admitting: Gastroenterology

## 2021-10-09 DIAGNOSIS — Z01419 Encounter for gynecological examination (general) (routine) without abnormal findings: Secondary | ICD-10-CM | POA: Diagnosis not present

## 2021-10-09 DIAGNOSIS — Z6825 Body mass index (BMI) 25.0-25.9, adult: Secondary | ICD-10-CM | POA: Diagnosis not present

## 2021-10-13 ENCOUNTER — Encounter: Payer: Self-pay | Admitting: Gastroenterology

## 2021-10-13 ENCOUNTER — Ambulatory Visit (AMBULATORY_SURGERY_CENTER): Payer: BC Managed Care – PPO | Admitting: Gastroenterology

## 2021-10-13 VITALS — BP 101/63 | HR 59 | Temp 97.1°F | Resp 10 | Ht 67.5 in | Wt 168.0 lb

## 2021-10-13 DIAGNOSIS — R1013 Epigastric pain: Secondary | ICD-10-CM

## 2021-10-13 DIAGNOSIS — R1011 Right upper quadrant pain: Secondary | ICD-10-CM

## 2021-10-13 DIAGNOSIS — K31A0 Gastric intestinal metaplasia, unspecified: Secondary | ICD-10-CM | POA: Diagnosis not present

## 2021-10-13 DIAGNOSIS — K2950 Unspecified chronic gastritis without bleeding: Secondary | ICD-10-CM | POA: Diagnosis not present

## 2021-10-13 DIAGNOSIS — K2970 Gastritis, unspecified, without bleeding: Secondary | ICD-10-CM | POA: Diagnosis not present

## 2021-10-13 DIAGNOSIS — B9681 Helicobacter pylori [H. pylori] as the cause of diseases classified elsewhere: Secondary | ICD-10-CM | POA: Diagnosis not present

## 2021-10-13 MED ORDER — SODIUM CHLORIDE 0.9 % IV SOLN: 500.0000 mL | Freq: Once | INTRAVENOUS | Status: DC

## 2021-10-14 ENCOUNTER — Telehealth: Payer: Self-pay

## 2021-10-14 NOTE — Telephone Encounter (Signed)
  Follow up Call-     10/13/2021    7:28 AM 04/15/2020    9:09 AM  Call back number  Post procedure Call Back phone  # [phone]  Permission to leave phone message Yes Yes    Follow up call, LVM

## 2021-10-22 ENCOUNTER — Other Ambulatory Visit: Payer: Self-pay

## 2021-10-22 MED ORDER — BISMUTH/METRONIDAZ/TETRACYCLIN 140-125-125 MG PO CAPS: 3.0000 | ORAL_CAPSULE | Freq: Four times a day (QID) | ORAL | 0 refills | Status: DC

## 2021-10-30 ENCOUNTER — Other Ambulatory Visit: Payer: Self-pay

## 2021-10-30 ENCOUNTER — Telehealth: Payer: Self-pay | Admitting: Pharmacy Technician

## 2021-10-30 ENCOUNTER — Other Ambulatory Visit (HOSPITAL_COMMUNITY): Payer: Self-pay

## 2021-10-30 MED ORDER — AMOXICILLIN 500 MG PO TABS: 1000.0000 mg | ORAL_TABLET | Freq: Two times a day (BID) | ORAL | 0 refills | Status: AC

## 2021-10-30 MED ORDER — BISMUTH SUBSALICYLATE 262 MG PO TABS: 2.0000 | ORAL_TABLET | Freq: Four times a day (QID) | ORAL | 0 refills | Status: AC

## 2021-10-30 MED ORDER — METRONIDAZOLE 500 MG PO TABS: 500.0000 mg | ORAL_TABLET | Freq: Two times a day (BID) | ORAL | 0 refills | Status: AC

## 2021-10-30 NOTE — Telephone Encounter (Signed)
Alternative components sent to the pharmacy for H pylori treatment. Pt aware

## 2021-10-30 NOTE — Telephone Encounter (Signed)
-----   Message from Loretha Stapler, RN sent at 10/29/2021  9:45 AM EDT ----- Have you received anything about Pylera for this pt?

## 2021-11-20 ENCOUNTER — Encounter: Payer: Self-pay | Admitting: Physician Assistant

## 2021-12-17 ENCOUNTER — Telehealth: Payer: Self-pay | Admitting: Gastroenterology

## 2021-12-17 NOTE — Telephone Encounter (Signed)
The pt completed H pylori treatment- she has a follow up appt scheduled in October.  She has some minimal BRBPR on a couple of occasions.  No other symptoms.  She did have hemorrhoids on last colon in 21.  She has only had 1-2 episodes as of today.  She was told to keep bowels soft and keep appt as planned.  She will use prep H and call us back if the bleeding worsens or she develops other complaints.

## 2021-12-17 NOTE — Telephone Encounter (Signed)
Patient called today to make a follow up appointment with Dr. Christella Hartigan following her recent colonoscopy and a trial of medication Dr. Christella Hartigan wanted her to try.  She has been scheduled to see Quentin Mulling 10/2 at 2:30 p.m.  The other thing she was calling about was that she had seen some blood in her stool and didn't know if there was anything she should do about that.  Please call patient and advise.  Thank you,.

## 2021-12-22 ENCOUNTER — Telehealth: Payer: Self-pay | Admitting: Gastroenterology

## 2021-12-22 ENCOUNTER — Other Ambulatory Visit: Payer: Self-pay | Admitting: Physician Assistant

## 2021-12-22 DIAGNOSIS — G2581 Restless legs syndrome: Secondary | ICD-10-CM

## 2021-12-22 NOTE — Telephone Encounter (Signed)
Patient called states she is still having symptoms ( abdominal pain/nausea/ bloody stool) and also would like to discuss her result. Requesting a call back as soon as possible. Please call to advise.

## 2021-12-22 NOTE — Telephone Encounter (Signed)
The pt states she continues to have the same abd pain she was having when last seen by Dr Christella Hartigan.  She still has some BRBPR with BM.  She has been scheduled to see Marchelle Folks on 9/27.  She will call her PCP in the meantime for eval or go to the ED if pain and bleeding worsens.  The pt has been advised of the information and verbalized understanding.

## 2021-12-24 ENCOUNTER — Other Ambulatory Visit: Payer: Self-pay

## 2021-12-24 ENCOUNTER — Encounter (HOSPITAL_COMMUNITY): Payer: Self-pay

## 2021-12-24 ENCOUNTER — Emergency Department (HOSPITAL_COMMUNITY): Payer: BC Managed Care – PPO

## 2021-12-24 ENCOUNTER — Emergency Department (HOSPITAL_COMMUNITY)
Admission: EM | Admit: 2021-12-24 | Discharge: 2021-12-25 | Disposition: A | Discharge status: Home / Self Care | Payer: BC Managed Care – PPO | Attending: Emergency Medicine | Admitting: Emergency Medicine

## 2021-12-24 DIAGNOSIS — R1010 Upper abdominal pain, unspecified: Secondary | ICD-10-CM | POA: Diagnosis not present

## 2021-12-24 DIAGNOSIS — K529 Noninfective gastroenteritis and colitis, unspecified: Secondary | ICD-10-CM | POA: Diagnosis not present

## 2021-12-24 DIAGNOSIS — R1013 Epigastric pain: Secondary | ICD-10-CM

## 2021-12-24 DIAGNOSIS — K6389 Other specified diseases of intestine: Secondary | ICD-10-CM | POA: Diagnosis not present

## 2021-12-24 DIAGNOSIS — R9431 Abnormal electrocardiogram [ECG] [EKG]: Secondary | ICD-10-CM | POA: Diagnosis not present

## 2021-12-24 LAB — URINALYSIS, ROUTINE W REFLEX MICROSCOPIC
Bacteria, UA: NONE SEEN
Bilirubin Urine: NEGATIVE
Glucose, UA: NEGATIVE mg/dL
Ketones, ur: NEGATIVE mg/dL
Nitrite: NEGATIVE
Protein, ur: NEGATIVE mg/dL
Specific Gravity, Urine: 1.012 (ref 1.005–1.030)
pH: 7 (ref 5.0–8.0)

## 2021-12-24 LAB — CBC
HCT: 38.8 % (ref 36.0–46.0)
Hemoglobin: 12.7 g/dL (ref 12.0–15.0)
MCH: 32.2 pg (ref 26.0–34.0)
MCHC: 32.7 g/dL (ref 30.0–36.0)
MCV: 98.5 fL (ref 80.0–100.0)
Platelets: 194 10*3/uL (ref 150–400)
RBC: 3.94 MIL/uL (ref 3.87–5.11)
RDW: 12.5 % (ref 11.5–15.5)
WBC: 8.7 10*3/uL (ref 4.0–10.5)
nRBC: 0 % (ref 0.0–0.2)

## 2021-12-24 LAB — COMPREHENSIVE METABOLIC PANEL
ALT: 17 U/L (ref 0–44)
AST: 17 U/L (ref 15–41)
Albumin: 4.1 g/dL (ref 3.5–5.0)
Alkaline Phosphatase: 84 U/L (ref 38–126)
Anion gap: 9 (ref 5–15)
BUN: 11 mg/dL (ref 6–20)
CO2: 29 mmol/L (ref 22–32)
Calcium: 9.3 mg/dL (ref 8.9–10.3)
Chloride: 104 mmol/L (ref 98–111)
Creatinine, Ser: 0.86 mg/dL (ref 0.44–1.00)
GFR, Estimated: >60 mL/min (ref >60)
Glucose, Bld: 167 mg/dL — ABNORMAL HIGH (ref 70–99)
Potassium: 4.2 mmol/L (ref 3.5–5.1)
Sodium: 142 mmol/L (ref 135–145)
Total Bilirubin: 0.6 mg/dL (ref 0.3–1.2)
Total Protein: 6.9 g/dL (ref 6.5–8.1)

## 2021-12-24 LAB — LIPASE, BLOOD: Lipase: 28 U/L (ref 11–51)

## 2021-12-24 MED ORDER — MORPHINE SULFATE (PF) 4 MG/ML IV SOLN
4.0000 mg | Freq: Once | INTRAVENOUS | Status: AC
  Administered 2021-12-24: 4 mg via INTRAVENOUS
  Filled 2021-12-24: qty 1

## 2021-12-24 MED ORDER — SODIUM CHLORIDE (PF) 0.9 % IJ SOLN
INTRAMUSCULAR | Status: AC
  Filled 2021-12-24: qty 50

## 2021-12-24 MED ORDER — ONDANSETRON HCL 4 MG/2ML IJ SOLN
4.0000 mg | Freq: Once | INTRAMUSCULAR | Status: AC
  Administered 2021-12-24: 4 mg via INTRAVENOUS
  Filled 2021-12-24: qty 2

## 2021-12-24 MED ORDER — IOHEXOL 300 MG/ML  SOLN
100.0000 mL | Freq: Once | INTRAMUSCULAR | Status: AC | PRN
  Administered 2021-12-24: 100 mL via INTRAVENOUS

## 2021-12-24 MED ORDER — HALOPERIDOL LACTATE 5 MG/ML IJ SOLN: 1.0000 mg | Freq: Once | INTRAMUSCULAR | Status: DC

## 2021-12-24 MED ORDER — OXYCODONE HCL 5 MG PO TABS: 5.0000 mg | ORAL_TABLET | Freq: Four times a day (QID) | ORAL | 0 refills | Status: AC | PRN

## 2021-12-24 MED ORDER — ONDANSETRON HCL 4 MG PO TABS: 4.0000 mg | ORAL_TABLET | Freq: Four times a day (QID) | ORAL | 0 refills | Status: DC

## 2021-12-24 MED ORDER — SUCRALFATE 1 GM/10ML PO SUSP: 1.0000 g | Freq: Three times a day (TID) | ORAL | 0 refills | Status: DC

## 2021-12-24 MED ORDER — FAMOTIDINE 20 MG PO TABS: 20.0000 mg | ORAL_TABLET | Freq: Two times a day (BID) | ORAL | 0 refills | Status: DC

## 2021-12-24 MED ORDER — SODIUM CHLORIDE 0.9 % IV BOLUS: 1000.0000 mL | Freq: Once | INTRAVENOUS | Status: DC

## 2021-12-24 MED ORDER — SODIUM CHLORIDE 0.9 % IV BOLUS
1000.0000 mL | Freq: Once | INTRAVENOUS | Status: AC
  Administered 2021-12-24: 1000 mL via INTRAVENOUS

## 2021-12-24 NOTE — ED Provider Triage Note (Signed)
Emergency Medicine Provider Triage Evaluation Note  [name] , a 52 y.o. female  was evaluated in triage.  Pt complains of abdominal pain and diarrhea.  Black stool  hx of upper gi infection  Review of Systems  Positive: Pai n and bloating  Negative: fever  Physical Exam  BP 114/70   Pulse 84   Temp 98.5 F (36.9 C) (Oral)   Resp 16   SpO2 100%  Gen:   Awake, no distress   Resp:  Normal effort  MSK:   Moves extremities without difficulty  Other:    Medical Decision Making  Medically screening exam initiated at 7:19 PM.  Appropriate orders placed.  Olin Pia was informed that the remainder of the evaluation will be completed by another provider, this initial triage assessment does not replace that evaluation, and the importance of remaining in the ED until their evaluation is complete.     Elson Areas, [place] 12/24/21 1920

## 2021-12-24 NOTE — Discharge Instructions (Addendum)
Call your gastroenterologist Dr. Christella Hartigan first thing tomorrow morning to establish follow-up visit for epigastric abdominal pain.  Medication for nausea and pain control sent to your pharmacy.

## 2021-12-24 NOTE — ED Provider Notes (Signed)
[facility]-EMERGENCY DEPT Provider Note   CSN: [id number] Arrival date & time: 12/24/21  1831     History  Chief Complaint  Patient presents with   Abdominal Pain    [name] is a 51 y.o. female.  Pt is a 51 yo female dx with H.Pylori after upper endoscopy by Dr. Christella Hartigan with [facility] presenting for abdominal pain. Pt admits to upper abdominal pain with associated nausea without vomiting x 1 week. Abdominal is worse approx 30 min after eating. Admits to bloating and chills. Denies fevers. Admits to mucous red bloody stool. Completed course of Bismuth/Metronidaz/Tetracyclin (PYLERA) 140-125-125 MG, amoxicillin, and Pepcid. States she never had relief of upper abdominal pain after H.Pylori treatment but pain was tolerable. Now it is severe and associated with nausea.   The history is provided by the patient. No language interpreter was used.  Abdominal Pain Associated symptoms: nausea   Associated symptoms: no chest pain, no chills, no cough, no diarrhea, no dysuria, no fever, no hematuria, no shortness of breath, no sore throat and no vomiting        Home Medications Prior to Admission medications   Medication Sig Start Date End Date Taking? Authorizing Provider  amoxicillin-clavulanate (AUGMENTIN) 875-125 MG tablet Take 1 tablet by mouth every 12 (twelve) hours. 12/25/21  Yes Edwin Dada P, DO  famotidine (PEPCID) 20 MG tablet Take 1 tablet (20 mg total) by mouth 2 (two) times daily. 12/24/21  Yes Edwin Dada P, DO  ondansetron (ZOFRAN) 4 MG tablet Take 1 tablet (4 mg total) by mouth every 6 (six) hours. 12/24/21  Yes Edwin Dada P, DO  oxyCODONE (ROXICODONE) 5 MG immediate release tablet Take 1 tablet (5 mg total) by mouth every 6 (six) hours as needed for up to 3 days for severe pain. 12/24/21 12/27/21 Yes Edwin Dada P, DO  sucralfate (CARAFATE) 1 GM/10ML suspension Take 10 mLs (1 g total) by mouth 4 (four) times daily -  with meals and at bedtime. 12/24/21   Yes Edwin Dada P, DO  Bismuth/Metronidaz/Tetracyclin [facility]) [phone] MG CAPS Take 3 tablets by mouth in the morning, at noon, in the evening, and at bedtime for 10 days. 10/22/21 11/01/21  Rachael Fee, MD  Cholecalciferol (VITAMIN D3) 2000 units TABS Take 1 capsule by mouth daily.    [provider]  gabapentin (NEURONTIN) 100 MG capsule TAKE 1 OR 2 CAPSULES BY MOUTH EVERY NIGHT AT BEDTIME AS NEEDED FOR RESTLESS LEG SYNDROME 12/23/21   Mayer Masker, PA-C  ibuprofen (ADVIL,MOTRIN) 200 MG tablet Take 200 mg by mouth as needed.    [provider]  levonorgestrel (MIRENA) 20 MCG/24HR IUD 1 each by Intrauterine route once.    [provider]  Multiple Vitamins-Minerals (ADULT ONE DAILY GUMMIES PO) Take 1 each by mouth daily.    [provider]  Omega-3 Fatty Acids (FISH OIL PO) Take by mouth.    [provider]  promethazine-dextromethorphan (PROMETHAZINE-DM) 6.25-15 MG/5ML syrup Take 5 mLs by mouth 4 (four) times daily as needed for cough. 09/07/21   Particia Nearing, PA-C  traZODone (DESYREL) 50 MG tablet TAKE 1/2 TO 1 TABLET(25 TO 50 MG) BY MOUTH AT BEDTIME AS NEEDED FOR SLEEP 05/12/21   Abonza, Maritza, PA-C  vitamin B-12 (CYANOCOBALAMIN) 1000 MCG tablet Take 1,000 mcg by mouth daily.    [provider]      Allergies    Patient has no known allergies.    Review of Systems  Review of Systems  Constitutional:  Negative for chills and fever.  HENT:  Negative for ear pain and sore throat.   Eyes:  Negative for pain and visual disturbance.  Respiratory:  Negative for cough and shortness of breath.   Cardiovascular:  Negative for chest pain and palpitations.  Gastrointestinal:  Positive for abdominal pain, blood in stool and nausea. Negative for diarrhea and vomiting.  Genitourinary:  Negative for dysuria and hematuria.  Musculoskeletal:  Negative for arthralgias and back pain.  Skin:  Negative for color change and rash.   Neurological:  Negative for seizures and syncope.  All other systems reviewed and are negative.   Physical Exam Updated Vital Signs BP 101/67 (BP Location: Right Arm)   Pulse 67   Temp 98.4 F (36.9 C) (Oral)   Resp 16   SpO2 99%  Physical Exam Vitals and nursing note reviewed.  Constitutional:      General: She is not in acute distress.    Appearance: She is well-developed.  HENT:     Head: Normocephalic and atraumatic.  Eyes:     Conjunctiva/sclera: Conjunctivae normal.  Cardiovascular:     Rate and Rhythm: Normal rate and regular rhythm.     Heart sounds: No murmur heard. Pulmonary:     Effort: Pulmonary effort is normal. No respiratory distress.     Breath sounds: Normal breath sounds.  Abdominal:     Palpations: Abdomen is soft.     Tenderness: There is generalized abdominal tenderness. There is no guarding or rebound.  Musculoskeletal:        General: No swelling.     Cervical back: Neck supple.  Skin:    General: Skin is warm and dry.     Capillary Refill: Capillary refill takes less than 2 seconds.  Neurological:     Mental Status: She is alert and oriented to person, place, and time.     GCS: GCS eye subscore is 4. GCS verbal subscore is 5. GCS motor subscore is 6.  Psychiatric:        Mood and Affect: Mood normal.     ED Results / Procedures / Treatments   Labs (all labs ordered are listed, but only abnormal results are displayed) Labs Reviewed  COMPREHENSIVE METABOLIC PANEL - Abnormal; Notable for the following components:      Result Value   Glucose, Bld 167 (*)    All other components within normal limits  URINALYSIS, ROUTINE W REFLEX MICROSCOPIC - Abnormal; Notable for the following components:   Hgb urine dipstick SMALL (*)    Leukocytes,Ua SMALL (*)    All other components within normal limits  LIPASE, BLOOD  CBC    EKG EKG Interpretation  Date/Time:  Thursday December 24 2021 19:05:39 EDT Ventricular Rate:  85 PR Interval:  156 QRS  Duration: 96 QT Interval:  362 QTC Calculation: 431 R Axis:   45 Text Interpretation: Sinus rhythm Low voltage, precordial leads Nonspecific T wave abnormality Baseline wander Confirmed by Cathren Laine ([id number]) on 12/25/2021 2:01:31 PM  Radiology CT ABDOMEN PELVIS W CONTRAST  Result Date: 12/25/2021 CLINICAL DATA:  Epigastric pain epigastric pain, nausea. For the last week patient has had upper abdominal pain and bloody/mucus in her stools. Feeling bloated. No vomiting but nauseous. EXAM: CT ABDOMEN AND PELVIS WITH CONTRAST TECHNIQUE: Multidetector CT imaging of the abdomen and pelvis was performed using the standard protocol following bolus administration of intravenous contrast. RADIATION DOSE REDUCTION: This exam was performed according to the departmental dose-optimization  program which includes automated exposure control, adjustment of the mA and/or kV according to patient size and/or use of iterative reconstruction technique. CONTRAST:  OMNIPAQUE IOHEXOL 300 MG/ML  SOLN COMPARISON:  None Available. FINDINGS: Lower chest: No acute abnormality. Hepatobiliary: No focal liver abnormality. No gallstones, gallbladder wall thickening, or pericholecystic fluid. No biliary dilatation. Pancreas: No focal lesion. Normal pancreatic contour. No surrounding inflammatory changes. No main pancreatic ductal dilatation. Spleen: Normal in size without focal abnormality. Adrenals/Urinary Tract: No adrenal nodule bilaterally. Bilateral kidneys enhance symmetrically. No hydronephrosis. No hydroureter. The urinary bladder is unremarkable. On delayed imaging, there is no urothelial wall thickening and there are no filling defects in the opacified portions of the bilateral collecting systems or ureters. Stomach/Bowel: Stomach is within normal limits. No evidence of small bowel wall thickening or dilatation. Diffuse bowel wall thickening and pericolonic fat stranding as well as mucosal hyperemia of the mid to distal  transverse colon, descending colon, and rectosigmoid colon. Status post appendectomy Vascular/Lymphatic: No abdominal aorta or iliac aneurysm. No abdominal, pelvic, or inguinal lymphadenopathy. Reproductive: T-shaped intrauterine device within the uterus in grossly appropriate position in the expected region of the endometrium. Uterus and bilateral adnexa are unremarkable. Other: No intraperitoneal free fluid. No intraperitoneal free gas. No organized fluid collection. Musculoskeletal: No abdominal wall hernia or abnormality. No suspicious lytic or blastic osseous lesions. No acute displaced fracture. Multilevel degenerative changes of the spine. IMPRESSION: 1. Colitis of the mid to distal transverse, descending, rectosigmoid colon. Differential diagnosis for etiology includes infection, inflammation, ischemia. 2. T-shaped intrauterine vice within the endometrium in grossly appropriate position. Electronically Signed   By: Tish Frederickson M.D.   On: 12/25/2021 00:02    Procedures Procedures    Medications Ordered in ED Medications  sodium chloride 0.9 % bolus 1,000 mL (0 mLs Intravenous Stopped 12/25/21 0110)  morphine (PF) 4 MG/ML injection 4 mg (4 mg Intravenous Given 12/24/21 2257)  ondansetron (ZOFRAN) injection 4 mg (4 mg Intravenous Given 12/24/21 2255)  iohexol (OMNIPAQUE) 300 MG/ML solution 100 mL (100 mLs Intravenous Contrast Given 12/24/21 2336)    ED Course/ Medical Decision Making/ A&P                           Medical Decision Making Amount and/or Complexity of Data Reviewed Labs: ordered. Radiology: ordered.  Risk Prescription drug management.   51 yo female dx with H.Pylori after upper endoscopy by Dr. Christella Hartigan with [facility] presenting for abdominal pain, nausea without vomiting, and blood in stool x 1 week. Pt is Aox3, no acute distress, afebrile, with stable vitals. Physical exam demonstrates soft abdomen with tenderness in the epigastric and lower abdominal regions. No guarding  or rigidity.   I independently interpreted patient's labs.  Labs demonstrate WBC count, liver profile, lipase, and renal function. CT abd/pelvis with iv contrast demonstrates colitis. Given patient's severity of symptoms and bloody stools, will treat with antibiotics. Pt has f/u appointment with GI scheduled in 3 weeks. Recommended to keep appointment but return to ED if symptoms worsen.  Otherwise no current signs/symptoms of sepsis. Hemoglobin stable. No active bleeding. Medications for nausea and pain control sent to pharmacy.   Patient in no distress and overall condition improved here in the ED. Detailed discussions were had with the patient regarding current findings, and need for close f/u with PCP or on call doctor. The patient has been instructed to return immediately if the symptoms worsen in any way for  re-evaluation. Patient verbalized understanding and is in agreement with current care plan. All questions answered prior to discharge.          Final Clinical Impression(s) / ED Diagnoses Final diagnoses:  Abdominal pain, epigastric  Colitis    Rx / DC Orders ED Discharge Orders          Ordered    amoxicillin-clavulanate (AUGMENTIN) 875-125 MG tablet  Every 12 hours        12/25/21 0021    ondansetron (ZOFRAN) 4 MG tablet  Every 6 hours        12/24/21 2349    oxyCODONE (ROXICODONE) 5 MG immediate release tablet  Every 6 hours PRN        12/24/21 2349    famotidine (PEPCID) 20 MG tablet  2 times daily        12/24/21 2354    sucralfate (CARAFATE) 1 GM/10ML suspension  3 times daily with meals & bedtime        12/24/21 2354              Franne Forts, DO 12/25/21 1528

## 2021-12-24 NOTE — ED Triage Notes (Signed)
For the last week patient has had upper abdominal pain and bloody/mucus in her stools. Feeling bloated. No vomiting but nauseous.

## 2021-12-25 ENCOUNTER — Telehealth: Payer: Self-pay

## 2021-12-25 DIAGNOSIS — R197 Diarrhea, unspecified: Secondary | ICD-10-CM

## 2021-12-25 DIAGNOSIS — K529 Noninfective gastroenteritis and colitis, unspecified: Secondary | ICD-10-CM

## 2021-12-25 DIAGNOSIS — K625 Hemorrhage of anus and rectum: Secondary | ICD-10-CM

## 2021-12-25 MED ORDER — AMOXICILLIN-POT CLAVULANATE 875-125 MG PO TABS: 1.0000 | ORAL_TABLET | Freq: Two times a day (BID) | ORAL | 0 refills | Status: DC

## 2021-12-25 NOTE — Telephone Encounter (Signed)
Mansouraty, Netty Starring., MD  Loretha Stapler, RN Tresia Revolorio,  I cannot see the entirety of the patient's ED evaluation but looks like she was discharged with Augmentin.  She has pretty significant colitis based on her imaging.  She should try to give a GI pathogen panel as well as a H. pylori stool antigen to evaluate if she still has H. pylori infection or not and try to understand her significant colitis.  Recommend a fecal calprotectin be obtained as well.  Pending her clinical course and repeat evaluation after antibiotic therapy, she could require repeat colonoscopy but hopefully this is just an infectious issue and hopefully her H. pylori has been treated effectively.  Time will tell.  GM

## 2021-12-25 NOTE — Telephone Encounter (Signed)
I spoke with the pt and she has agreed to stool studies.  The order has been entered and she will pick up today.

## 2021-12-27 ENCOUNTER — Emergency Department (HOSPITAL_COMMUNITY)
Admission: EM | Admit: 2021-12-27 | Discharge: 2021-12-27 | Disposition: A | Discharge status: Home / Self Care | Payer: BC Managed Care – PPO | Attending: Emergency Medicine | Admitting: Emergency Medicine

## 2021-12-27 ENCOUNTER — Encounter (HOSPITAL_COMMUNITY): Payer: Self-pay | Admitting: Emergency Medicine

## 2021-12-27 DIAGNOSIS — K529 Noninfective gastroenteritis and colitis, unspecified: Secondary | ICD-10-CM | POA: Diagnosis not present

## 2021-12-27 DIAGNOSIS — R109 Unspecified abdominal pain: Secondary | ICD-10-CM | POA: Diagnosis not present

## 2021-12-27 DIAGNOSIS — E860 Dehydration: Secondary | ICD-10-CM

## 2021-12-27 DIAGNOSIS — A0472 Enterocolitis due to Clostridium difficile, not specified as recurrent: Secondary | ICD-10-CM

## 2021-12-27 DIAGNOSIS — Z20822 Contact with and (suspected) exposure to covid-19: Secondary | ICD-10-CM | POA: Insufficient documentation

## 2021-12-27 LAB — CBC WITH DIFFERENTIAL/PLATELET
Abs Immature Granulocytes: 0.14 10*3/uL — ABNORMAL HIGH (ref 0.00–0.07)
Basophils Absolute: 0.1 10*3/uL (ref 0.0–0.1)
Basophils Relative: 0 %
Eosinophils Absolute: 0.1 10*3/uL (ref 0.0–0.5)
Eosinophils Relative: 1 %
HCT: 42.4 % (ref 36.0–46.0)
Hemoglobin: 13.9 g/dL (ref 12.0–15.0)
Immature Granulocytes: 1 %
Lymphocytes Relative: 4 %
Lymphs Abs: 0.7 10*3/uL (ref 0.7–4.0)
MCH: 32 pg (ref 26.0–34.0)
MCHC: 32.8 g/dL (ref 30.0–36.0)
MCV: 97.5 fL (ref 80.0–100.0)
Monocytes Absolute: 0.5 10*3/uL (ref 0.1–1.0)
Monocytes Relative: 3 %
Neutro Abs: 15.7 10*3/uL — ABNORMAL HIGH (ref 1.7–7.7)
Neutrophils Relative %: 91 %
Platelets: 204 10*3/uL (ref 150–400)
RBC: 4.35 MIL/uL (ref 3.87–5.11)
RDW: 12.9 % (ref 11.5–15.5)
WBC: 17.2 10*3/uL — ABNORMAL HIGH (ref 4.0–10.5)
nRBC: 0 % (ref 0.0–0.2)

## 2021-12-27 LAB — COMPREHENSIVE METABOLIC PANEL
ALT: 26 U/L (ref 0–44)
AST: 17 U/L (ref 15–41)
Albumin: 3.7 g/dL (ref 3.5–5.0)
Alkaline Phosphatase: 97 U/L (ref 38–126)
Anion gap: 10 (ref 5–15)
BUN: 7 mg/dL (ref 6–20)
CO2: 28 mmol/L (ref 22–32)
Calcium: 9 mg/dL (ref 8.9–10.3)
Chloride: 103 mmol/L (ref 98–111)
Creatinine, Ser: 0.82 mg/dL (ref 0.44–1.00)
GFR, Estimated: >60 mL/min (ref >60)
Glucose, Bld: 139 mg/dL — ABNORMAL HIGH (ref 70–99)
Potassium: 3.9 mmol/L (ref 3.5–5.1)
Sodium: 141 mmol/L (ref 135–145)
Total Bilirubin: 0.5 mg/dL (ref 0.3–1.2)
Total Protein: 7.4 g/dL (ref 6.5–8.1)

## 2021-12-27 LAB — URINALYSIS, ROUTINE W REFLEX MICROSCOPIC
Bacteria, UA: NONE SEEN
Bilirubin Urine: NEGATIVE
Glucose, UA: NEGATIVE mg/dL
Ketones, ur: NEGATIVE mg/dL
Nitrite: NEGATIVE
Protein, ur: 30 mg/dL — AB
Specific Gravity, Urine: 1.018 (ref 1.005–1.030)
pH: 5 (ref 5.0–8.0)

## 2021-12-27 LAB — RESP PANEL BY RT-PCR (FLU A&B, COVID) ARPGX2
Influenza A by PCR: NEGATIVE
Influenza B by PCR: NEGATIVE
SARS Coronavirus 2 by RT PCR: NEGATIVE

## 2021-12-27 LAB — C DIFFICILE QUICK SCREEN W PCR REFLEX
C Diff antigen: POSITIVE — AB
C Diff interpretation: DETECTED
C Diff toxin: POSITIVE — AB

## 2021-12-27 LAB — LIPASE, BLOOD: Lipase: 21 U/L (ref 11–51)

## 2021-12-27 MED ORDER — SODIUM CHLORIDE 0.9 % IV BOLUS
1000.0000 mL | Freq: Once | INTRAVENOUS | Status: AC
  Administered 2021-12-27: 1000 mL via INTRAVENOUS

## 2021-12-27 MED ORDER — ONDANSETRON HCL 4 MG PO TABS: 4.0000 mg | ORAL_TABLET | Freq: Four times a day (QID) | ORAL | 0 refills | Status: DC

## 2021-12-27 MED ORDER — METRONIDAZOLE 500 MG PO TABS: 500.0000 mg | ORAL_TABLET | Freq: Two times a day (BID) | ORAL | 0 refills | Status: DC

## 2021-12-27 MED ORDER — VANCOMYCIN HCL 125 MG PO CAPS
125.0000 mg | ORAL_CAPSULE | Freq: Four times a day (QID) | ORAL | Status: DC
  Administered 2021-12-27: 125 mg via ORAL
  Filled 2021-12-27: qty 1

## 2021-12-27 MED ORDER — DIPHENHYDRAMINE HCL 50 MG/ML IJ SOLN
12.5000 mg | Freq: Once | INTRAMUSCULAR | Status: AC
  Administered 2021-12-27: 12.5 mg via INTRAVENOUS
  Filled 2021-12-27: qty 1

## 2021-12-27 MED ORDER — ALIGN 4 MG PO CAPS: 1.0000 | ORAL_CAPSULE | Freq: Two times a day (BID) | ORAL | 0 refills | Status: DC

## 2021-12-27 MED ORDER — VANCOMYCIN HCL 125 MG PO CAPS
125.0000 mg | ORAL_CAPSULE | Freq: Four times a day (QID) | ORAL | 0 refills | Status: DC
Start: 2021-12-27 — End: 2021-12-27

## 2021-12-27 NOTE — ED Provider Notes (Addendum)
[facility]-EMERGENCY DEPT Provider Note   CSN: [id number] Arrival date & time: 12/27/21  1424     History  Chief Complaint  Patient presents with   Abdominal Pain    [name] is a 51 y.o. female.  Pt is a 51 yo female with a pmhx significant for a recent dx of h.pylori on EGD.   She was treated with Bismuth/Metronidaz/Tetracyclin (PYLERA) 140-125-125 MG, amoxicillin, and Pepcid. She was seen here on 9/7, had a ct of her abd/pelvis which showed colitis.  Pt was put on augmentin and d/c home.  Since d/c, she has been having worsening pain with diarrhea.  She has a GI f/u on 9/27, but was not sure she could wait until then.       Home Medications Prior to Admission medications   Medication Sig Start Date End Date Taking? Authorizing Provider  metroNIDAZOLE (FLAGYL) 500 MG tablet Take 1 tablet (500 mg total) by mouth 2 (two) times daily for 10 days. 12/27/21 01/06/22 Yes Jacalyn Lefevre, MD  Probiotic Product (ALIGN) 4 MG CAPS Take 1 capsule (4 mg total) by mouth 2 (two) times daily. 12/27/21  Yes Jacalyn Lefevre, MD  Bismuth/Metronidaz/Tetracyclin [facility]) [phone] MG CAPS Take 3 tablets by mouth in the morning, at noon, in the evening, and at bedtime for 10 days. 10/22/21 11/01/21  Rachael Fee, MD  Cholecalciferol (VITAMIN D3) 2000 units TABS Take 1 capsule by mouth daily.    [provider]  famotidine (PEPCID) 20 MG tablet Take 1 tablet (20 mg total) by mouth 2 (two) times daily. 12/24/21   Edwin Dada P, DO  gabapentin (NEURONTIN) 100 MG capsule TAKE 1 OR 2 CAPSULES BY MOUTH EVERY NIGHT AT BEDTIME AS NEEDED FOR RESTLESS LEG SYNDROME 12/23/21   Mayer Masker, PA-C  ibuprofen (ADVIL,MOTRIN) 200 MG tablet Take 200 mg by mouth as needed.    [provider]  levonorgestrel (MIRENA) 20 MCG/24HR IUD 1 each by Intrauterine route once.    [provider]  Multiple Vitamins-Minerals (ADULT ONE DAILY GUMMIES PO) Take 1 each by mouth  daily.    [provider]  Omega-3 Fatty Acids (FISH OIL PO) Take by mouth.    [provider]  ondansetron (ZOFRAN) 4 MG tablet Take 1 tablet (4 mg total) by mouth every 6 (six) hours. 12/27/21   Jacalyn Lefevre, MD  oxyCODONE (ROXICODONE) 5 MG immediate release tablet Take 1 tablet (5 mg total) by mouth every 6 (six) hours as needed for up to 3 days for severe pain. 12/24/21 12/27/21  Edwin Dada P, DO  promethazine-dextromethorphan (PROMETHAZINE-DM) 6.25-15 MG/5ML syrup Take 5 mLs by mouth 4 (four) times daily as needed for cough. 09/07/21   Particia Nearing, PA-C  sucralfate (CARAFATE) 1 GM/10ML suspension Take 10 mLs (1 g total) by mouth 4 (four) times daily -  with meals and at bedtime. 12/24/21   Edwin Dada P, DO  traZODone (DESYREL) 50 MG tablet TAKE 1/2 TO 1 TABLET(25 TO 50 MG) BY MOUTH AT BEDTIME AS NEEDED FOR SLEEP 05/12/21   Abonza, Maritza, PA-C  vitamin B-12 (CYANOCOBALAMIN) 1000 MCG tablet Take 1,000 mcg by mouth daily.    [provider]      Allergies    Patient has no known allergies.    Review of Systems   Review of Systems  Gastrointestinal:  Positive for abdominal distention, diarrhea, nausea and vomiting.  All other systems reviewed and are negative.   Physical Exam Updated Vital Signs  BP 101/62   Pulse 89   Temp 98.8 F (37.1 C)   Resp 19   SpO2 100%  Physical Exam Vitals and nursing note reviewed.  Constitutional:      Appearance: She is well-developed.  HENT:     Head: Normocephalic and atraumatic.     Mouth/Throat:     Mouth: Mucous membranes are dry.  Eyes:     Extraocular Movements: Extraocular movements intact.     Pupils: Pupils are equal, round, and reactive to light.  Cardiovascular:     Rate and Rhythm: Normal rate and regular rhythm.  Abdominal:     General: Abdomen is flat. Bowel sounds are normal.     Palpations: Abdomen is soft.     Tenderness: There is generalized abdominal tenderness.  Skin:    General:  Skin is warm.     Capillary Refill: Capillary refill takes less than 2 seconds.  Neurological:     General: No focal deficit present.     Mental Status: She is alert and oriented to person, place, and time.  Psychiatric:        Mood and Affect: Mood normal.        Behavior: Behavior normal.     ED Results / Procedures / Treatments   Labs (all labs ordered are listed, but only abnormal results are displayed) Labs Reviewed  C DIFFICILE QUICK SCREEN W PCR REFLEX   - Abnormal; Notable for the following components:      Result Value   C Diff antigen POSITIVE (*)    C Diff toxin POSITIVE (*)    All other components within normal limits  COMPREHENSIVE METABOLIC PANEL - Abnormal; Notable for the following components:   Glucose, Bld 139 (*)    All other components within normal limits  CBC WITH DIFFERENTIAL/PLATELET - Abnormal; Notable for the following components:   WBC 17.2 (*)    Neutro Abs 15.7 (*)    Abs Immature Granulocytes 0.14 (*)    All other components within normal limits  URINALYSIS, ROUTINE W REFLEX MICROSCOPIC - Abnormal; Notable for the following components:   Hgb urine dipstick MODERATE (*)    Protein, ur 30 (*)    Leukocytes,Ua SMALL (*)    All other components within normal limits  RESP PANEL BY RT-PCR (FLU A&B, COVID) ARPGX2  LIPASE, BLOOD    EKG None  Radiology No results found.  Procedures Procedures    Medications Ordered in ED Medications  vancomycin (VANCOCIN) capsule 125 mg (125 mg Oral Given 12/27/21 2118)  diphenhydrAMINE (BENADRYL) injection 12.5 mg (has no administration in time range)  sodium chloride 0.9 % bolus 1,000 mL (1,000 mLs Intravenous New Bag/Given 12/27/21 2133)    ED Course/ Medical Decision Making/ A&P                           Medical Decision Making Risk OTC drugs. Prescription drug management.   This patient presents to the ED for concern of abd pain, this involves an extensive number of treatment options, and is a  complaint that carries with it a high risk of complications and morbidity.  The differential diagnosis includes colitis, gastroenteritis, c. dif   Co morbidities that complicate the patient evaluation  H. pylori   Additional history obtained:  Additional history obtained from epic chart review    Lab Tests:  I Ordered, and personally interpreted labs.  The pertinent results include:  cbc with wbc elevated at  17.2, cmp nl, ua +protein, covid/flu neg, lip nl, C.diff +   Imaging Studies ordered:  I reviewed ct from 9/7 I agree with the radiologist interpretation   Cardiac Monitoring:  The patient was maintained on a cardiac monitor.  I personally viewed and interpreted the cardiac monitored which showed an underlying rhythm of: nsr   Medicines ordered and prescription drug management:  I ordered medication including ivfs  for dehydration and vancomycin for c.diff  Reevaluation of the patient after these medicines showed that the patient improved I have reviewed the patients home medicines and have made adjustments as needed   Test Considered:  Ct, but she just had one and it showed colitis.  Abd is not peritonitic, so I doubt perforation   Critical Interventions:  ivfs    Problem List / ED Course:  C diff colitis:  pt given ivfs and feels better.  Vancomycin ordered.  Return if worse.  F/u with gi. Allergic rxn:  pt did develop redness across her face and chest after vancomycin was given.  She denies any sob.  Pt given benadryl.  Due to the possible allergy, pt d/c with flagyl instead of the vancomycin.  I    Reevaluation:  After the interventions noted above, I reevaluated the patient and found that they have :improved   Social Determinants of Health:  Lives at home   Dispostion:  After consideration of the diagnostic results and the patients response to treatment, I feel that the patent would benefit from discharge with outpatient f/u.           Final Clinical Impression(s) / ED Diagnoses Final diagnoses:  Enteritis due to Clostridium difficile  Dehydration    Rx / DC Orders ED Discharge Orders          Ordered    ondansetron (ZOFRAN) 4 MG tablet  Every 6 hours        12/27/21 2226    Probiotic Product (ALIGN) 4 MG CAPS  2 times daily        12/27/21 2226    vancomycin (VANCOCIN) 125 MG capsule  4 times daily,   Status:  Discontinued        12/27/21 2226    metroNIDAZOLE (FLAGYL) 500 MG tablet  2 times daily        12/27/21 2300              Jacalyn Lefevre, MD 12/27/21 2230    Jacalyn Lefevre, MD 12/27/21 2302

## 2021-12-27 NOTE — ED Provider Triage Note (Signed)
Emergency Medicine Provider Triage Evaluation Note  [name] , a 51 y.o. female  was evaluated in triage.  Pt complains of abdominal pain, bloating, cramping, chills.  Patient states that symptoms been present since the first 1 to 2 weeks of June.  She has recently been on 2 rounds of antibiotic therapy 1 by her GI doctor for treatment of H. pylori of quadruple therapy.  She was recently seen in the ED on 9/7 and placed on Augmentin.  She states that her diarrhea has been persistent and she has been having bowel movements while asleep.  Bowel movements are described as liquid with no evidence of hematochezia or or melena..  She has an upcoming appointment with GI doctor on 9/27.    Review of Systems  Positive: See above Negative:   Physical Exam  BP 115/70 (BP Location: Right Arm)   Pulse 98   Temp 100.2 F (37.9 C) (Oral)   Resp 18   SpO2 92%  Gen:   Awake, no distress   Resp:  Normal effort  MSK:   Moves extremities without difficulty  Other:  Diffuse abdominal pain  Medical Decision Making  Medically screening exam initiated at 2:49 PM.  Appropriate orders placed.  [name] was informed that the remainder of the evaluation will be completed by another provider, this initial triage assessment does not replace that evaluation, and the importance of remaining in the ED until their evaluation is complete.     Peter Garter, [place] 12/27/21 1453

## 2021-12-27 NOTE — ED Triage Notes (Signed)
Patient reports continued abdominal cramping with N/V since seen last week for same and dx with ulcerative colitis. Reports feeling bloated and GI appointment not until 9/27.

## 2021-12-27 NOTE — ED Notes (Signed)
Noticeable rash to face and neck on patient, provider made aware

## 2021-12-27 NOTE — Discharge Instructions (Signed)
STOP the Augmentin.

## 2021-12-30 ENCOUNTER — Telehealth: Payer: Self-pay

## 2021-12-30 NOTE — Telephone Encounter (Signed)
Message -----  From: Lemar Lofty., MD  Sent: 12/28/2021  12:35 PM EDT  To: Rachael Fee, MD; Doree Albee, PA-C; *   Dr. Particia Nearing,  Thank you for forwarding this to [place].  Dr. Christella Hartigan is away for the foreseeable future.  Agree with plan of action for C. difficile treatment as you have outlined with oral vancomycin and initiated her on.  I am placing PA Steffanie Dunn on here as she will be seeing her later this month.   Katlin Bortner,  Please place a call out to the patient later this week to see how she is doing and update AC.  Thanks.  GM

## 2021-12-30 NOTE — Telephone Encounter (Signed)
I spoke with the pt and she tells me that she is doing much better after the ED visit and abx.  She is not on vanc due to a rash she developed.  She was transitioned to flagyl 500 mg twice daily for 10 days.  She will keep appt as planned on 9/27 with [facility]  She will call back if she has any return or worse symptoms.

## 2021-12-30 NOTE — Telephone Encounter (Signed)
Inbound call from patient stating that she went to the ER last Thursday and Sunday and wanted to let our office know what was going on. Stool sample came back from ER and showed C-diff. Patient is requesting a call back. Please advise.

## 2021-12-30 NOTE — Telephone Encounter (Signed)
Thank you for update. Agree with current plan. GM

## 2021-12-31 ENCOUNTER — Telehealth: Payer: Self-pay | Admitting: Gastroenterology

## 2021-12-31 NOTE — Telephone Encounter (Signed)
The pt is calling with complaints of being off balance- dizzy-jittery since being on flagyl since Monday for C diff. Last dose was last night. She is only taking pepcid and probiotic at this time as well as the Flagyl.   Other wise-diarrhea has resolved and stools are formed.  She was given vanc on Sunday in the hospital but had an allergic reaction- face rash and redness.  Was given benadryl and symptoms resolved.  Please advise

## 2021-12-31 NOTE — Telephone Encounter (Signed)
Patient called states she would like to speak to a nurse regarding her antibiotic. Please call to advise.

## 2022-01-01 ENCOUNTER — Other Ambulatory Visit: Payer: Self-pay

## 2022-01-01 MED ORDER — FIDAXOMICIN 200 MG PO TABS: 200.0000 mg | ORAL_TABLET | Freq: Two times a day (BID) | ORAL | 0 refills | Status: DC

## 2022-01-01 NOTE — Telephone Encounter (Signed)
Patty, Thank you for letting me know this. Flagyl very rarely can have some neurological type of side effects. At this point, it may be ideal for her to stop the medication. She has not had a complete treatment for the C. difficile infection and I worry that if we stop all treatments completely and just monitor that she may have issues. We could try that. The other thing we could try is to get her on Dificid and do a true 10-day course since she cannot tolerate oral vancomycin or Flagyl. I would offer her the opportunity to initiate Dificid, but if she wants to just wait and see what happens by stopping the Flagyl and see how her bowels move and hopefully remain stable that may not be unreasonable to since she is now had potentially 2 antibiotics and had reactions to both. Let me know what she decides. Thanks. GM

## 2022-01-01 NOTE — Telephone Encounter (Signed)
The pt has been advised and states she would prefer to try dificid. I have sent the prescription to the pharmacy.  The pt has been advised that it may take a few days if PA is required.

## 2022-01-11 NOTE — Progress Notes (Unsigned)
01/13/2022 [name] [id number] [birth year]  Referring provider: Mayer Masker, PA-C Primary GI doctor: Dr. Lavon Paganini (Dr. Christella Hartigan)  ASSESSMENT AND PLAN:   Assessment: 51 y.o. female here for assessment of the following: 1. Helicobacter pylori gastritis   2. Enteritis due to Clostridium difficile   3. Family history of colon cancer   2021 colonoscopy without any polyps, due to family history of colon cancer in mother in her 2s recall 5 years. (2026) 10/13/2021 EGD positive H. pylori gastritis treated with Pylera  Completed H pylori treatment in July with Pylera, Cdiff positive in Sept ( also given augmentin prior).  Possible reaction versus flushing with vancomycin, neurological issues with flagyl Never took dificin but symptoms are improved, BM every other day, formed.   Plan: Eradication study today If patient needs repeat therapy she will be at very high risk for reoccurrence of Cdiff with PPI/ABX, would maybe want to consider prophylactic treatment.  Poor reaction to flagyl, some rash versus flushing with vancomycin, would need to try dificin. If she also had a reaction to that could consider referral to allergist to confirm true allergy to vancomycin or consider referral refer to [facility]orer at [facility] who specializes in FMT but hopefully that will not be needed.  Counseled on hand washing and reoccurrence of Cdiff Due for colonoscopy 2026  Orders Placed This Encounter  Procedures   Helicobacter pylori special antigen   CBC with Differential/Platelet    History of Present Illness:  51 y.o. female  with a past medical history of family history of colon cancer, H. pylori gastritis, recent C. difficile and others listed below, returns to clinic today for evaluation of GERD, C. difficile. 2021 colonoscopy without any polyps, due to family history of colon cancer in mother in her 67s recall 5 years. (2026) 07/22/2021 office visit with Dr. Christella Hartigan for epigastric pain.   02/2020 abdominal ultrasound showed no acute process, GB normal. 08/04/2021 HIDA scan with low normal gallbladder ejection fraction 34%, unremarkable bile ducts. 10/13/2021 EGD positive H. pylori gastritis treated with Pylera 12/24/2021 CT abdomen pelvis with contrast for epigastric pain and nausea bloating the ER showed colitis of mid to distal transverse descending rectosigmoid colon-given Augmentin 12/27/2021 patient returns with worsening diarrhea nausea and vomiting found to have C. difficile antigen and toxin positive, white blood cell count 17.2-treated with vancomycin She is not on vanc due to a rash she developed.   She was transitioned to flagyl 500 mg twice daily for 10 days.   She states she is not having AB pain, pressure, bloating, blood in the stool.  BM once a day, occ will have every other day.   She had very red face, thought it was a hot flash but no rash anywhere else, no trouble swallowing or issues with breathing.  She only had one dose of vancomycin in the ER. Then given flagyl 500 mg twice a day for 10 days but only took two pills of it.  She had jittery, could not sleep, felt it was affecting her thoughts with odd random thoughts, could not concentrate, and stopped it.  Sent in dificid but did not pick it up.   She is on probiotic, has not been on PPI for several weeks.  She has occ GERD with certain foods, but denies nausea, vomiting.  She still has occ sharp upper AB pain, not daily, can be with water.  She has hot flashes with chills, no fever.   She  reports that she  has never smoked. She has never used smokeless tobacco. She reports that she does not drink alcohol and does not use drugs. Her family history includes Alcohol abuse in her maternal uncle; Breast cancer (age of onset: 80) in her sister; Cancer in her maternal grandmother, mother, and sister; Colon cancer in her paternal uncle; Colon cancer (age of onset: 61) in her mother; Healthy in her son and son;  Heart disease in her paternal grandmother; Hyperlipidemia in her father; Hypertension in her father and mother; Hypothyroidism in her father and sister; Kidney disease in her maternal grandfather; Pneumonia in her paternal grandfather; Uterine cancer (age of onset: 29) in her sister.   Current Medications:   Current Outpatient Medications (Endocrine & Metabolic):    levonorgestrel (MIRENA) 20 MCG/24HR IUD, 1 each by Intrauterine route once.   Current Outpatient Medications (Respiratory):    promethazine-dextromethorphan (PROMETHAZINE-DM) 6.25-15 MG/5ML syrup, Take 5 mLs by mouth 4 (four) times daily as needed for cough.  Current Outpatient Medications (Analgesics):    ibuprofen (ADVIL,MOTRIN) 200 MG tablet, Take 200 mg by mouth as needed.  Current Outpatient Medications (Hematological):    vitamin B-12 (CYANOCOBALAMIN) 1000 MCG tablet, Take 1,000 mcg by mouth daily.  Current Outpatient Medications (Other):    Cholecalciferol (VITAMIN D3) 2000 units TABS, Take 1 capsule by mouth daily.   gabapentin (NEURONTIN) 100 MG capsule, TAKE 1 OR 2 CAPSULES BY MOUTH EVERY NIGHT AT BEDTIME AS NEEDED FOR RESTLESS LEG SYNDROME   Multiple Vitamins-Minerals (ADULT ONE DAILY GUMMIES PO), Take 1 each by mouth daily.   Omega-3 Fatty Acids (FISH OIL PO), Take by mouth.   Probiotic Product (ALIGN) 4 MG CAPS, Take 1 capsule (4 mg total) by mouth 2 (two) times daily.   famotidine (PEPCID) 20 MG tablet, Take 1 tablet (20 mg total) by mouth 2 (two) times daily. (Patient not taking: Reported on 01/13/2022)   fidaxomicin (DIFICID) 200 MG TABS tablet, Take 1 tablet (200 mg total) by mouth 2 (two) times daily. (Patient not taking: Reported on 01/13/2022)   ondansetron (ZOFRAN) 4 MG tablet, Take 1 tablet (4 mg total) by mouth every 6 (six) hours. (Patient not taking: Reported on 01/13/2022)   sucralfate (CARAFATE) 1 GM/10ML suspension, Take 10 mLs (1 g total) by mouth 4 (four) times daily -  with meals and at bedtime.  (Patient not taking: Reported on 01/13/2022)   traZODone (DESYREL) 50 MG tablet, TAKE 1/2 TO 1 TABLET(25 TO 50 MG) BY MOUTH AT BEDTIME AS NEEDED FOR SLEEP (Patient not taking: Reported on 01/13/2022)  Surgical History:  She  has a past surgical history that includes Laparoscopic appendectomy (2012); Cesarean section (1992, 2005); Appendectomy; Colonoscopy (2021); and Upper gastrointestinal endoscopy.  Current Medications, Allergies, Past Medical History, Past Surgical History, Family History and Social History were reviewed in [employer] record.  Physical Exam: BP 102/72   Pulse 82   Ht 5\' 7"  (1.702 m)   Wt 161 lb (73 kg)   BMI 25.22 kg/m  General:   Pleasant, well developed female in no acute distress Heart : Regular rate and rhythm; no murmurs Pulm: Clear anteriorly; no wheezing Abdomen:  Soft, Obese AB, Active bowel sounds. No tenderness . , No organomegaly appreciated. Rectal: Not evaluated Extremities:  without  edema. Neurologic:  Alert and  oriented x4;  No focal deficits.  Psych:  Cooperative. Normal mood and affect.   Vladimir Crofts, PA-C 01/13/22

## 2022-01-13 ENCOUNTER — Other Ambulatory Visit (INDEPENDENT_AMBULATORY_CARE_PROVIDER_SITE_OTHER): Payer: BC Managed Care – PPO

## 2022-01-13 ENCOUNTER — Encounter: Payer: Self-pay | Admitting: Physician Assistant

## 2022-01-13 ENCOUNTER — Ambulatory Visit (INDEPENDENT_AMBULATORY_CARE_PROVIDER_SITE_OTHER): Payer: BC Managed Care – PPO | Admitting: Physician Assistant

## 2022-01-13 VITALS — BP 102/72 | HR 82 | Ht 67.0 in | Wt 161.0 lb

## 2022-01-13 DIAGNOSIS — K2970 Gastritis, unspecified, without bleeding: Secondary | ICD-10-CM

## 2022-01-13 DIAGNOSIS — A0472 Enterocolitis due to Clostridium difficile, not specified as recurrent: Secondary | ICD-10-CM

## 2022-01-13 DIAGNOSIS — B9681 Helicobacter pylori [H. pylori] as the cause of diseases classified elsewhere: Secondary | ICD-10-CM | POA: Diagnosis not present

## 2022-01-13 DIAGNOSIS — Z800 Family history of malignant neoplasm of digestive organs: Secondary | ICD-10-CM | POA: Diagnosis not present

## 2022-01-13 LAB — CBC WITH DIFFERENTIAL/PLATELET
Basophils Absolute: 0 10*3/uL (ref 0.0–0.1)
Basophils Relative: 0.8 % (ref 0.0–3.0)
Eosinophils Absolute: 0.1 10*3/uL (ref 0.0–0.7)
Eosinophils Relative: 2.8 % (ref 0.0–5.0)
HCT: 37 % (ref 36.0–46.0)
Hemoglobin: 12.7 g/dL (ref 12.0–15.0)
Lymphocytes Relative: 31 % (ref 12.0–46.0)
Lymphs Abs: 1.4 10*3/uL (ref 0.7–4.0)
MCHC: 34.4 g/dL (ref 30.0–36.0)
MCV: 94.4 fl (ref 78.0–100.0)
Monocytes Absolute: 0.4 10*3/uL (ref 0.1–1.0)
Monocytes Relative: 8 % (ref 3.0–12.0)
Neutro Abs: 2.7 10*3/uL (ref 1.4–7.7)
Neutrophils Relative %: 57.4 % (ref 43.0–77.0)
Platelets: 181 10*3/uL (ref 150.0–400.0)
RBC: 3.91 Mil/uL (ref 3.87–5.11)
RDW: 14.3 % (ref 11.5–15.5)
WBC: 4.7 10*3/uL (ref 4.0–10.5)

## 2022-01-13 NOTE — Patient Instructions (Addendum)
Clostridioides Difficile Infection Clostridioides difficile infection, or C. diff, is an infection that is caused by C. diff germs (bacteria). This infection may happen after you take antibiotics that kill other germs and let C. diff germs grow. C. diff can be spread from person to person (is contagious). What are the causes? Taking certain antibiotics. Coming in contact with people, food, or things that have C. diff. What increases the risk? Taking certain antibiotics for a long time. Staying in a hospital or long-term care facility for a long time. Being age 50 or older. Having had C. diff before or been exposed to C. diff. Having a weak disease-fighting system (immune system). Taking medicines that treat stomach acid. Having serious health problems, including: Colon cancer. Inflammatory bowel disease (IBD). Having had a procedure or surgery on your digestive system. What are the signs or symptoms? Watery poop (diarrhea). Fever. Not feeling hungry. Feeling like you may vomit. Swelling, pain, cramps, or a tender belly. How is this treated? Treatment may include: Stopping the antibiotics that caused the C. diff infection. Taking antibiotics that kill C. diff. Placing poop from a healthy person into your colon (fecal transplant). Doing surgery to take out the infected part of the colon. Follow these instructions at home: Medicines Take over-the-counter and prescription medicines only as told by your doctor. Take antibiotic medicine as told by your doctor. Do not stop taking it even if you start to feel better. Do not take medicines to treat watery poop unless your doctor tells you to. Eating and drinking  Follow instructions from your doctor about what to eat and drink. This may include eating bland foods in small amounts, such as: Bananas. Applesauce. Rice. Lean meats. Toast. Crackers. To prevent loss of fluid in your body (dehydration): Take in enough fluids to keep  your pee pale yellow. This includes water, ice chips, clear fruit juice with water added to it, or low-calorie sports drinks. Take an ORS (oral rehydration solution). This drink is sold in pharmacies and retail stores. Avoid milk, caffeine, and alcohol. General instructions Wash your hands often with soap and water. Do this for at least 20 seconds. Take a bath or shower every day. Return to your normal activities when your doctor says that it is safe. Keep all follow-up visits. How is this prevented? Personal hygiene  Wash your hands often with soap and water. Do this for at least 20 seconds. Wash your hands before you cook and after you use the bathroom. Other people should wash their hands too, especially: People who live with you. People who visit you in a hospital or clinic. Contact precautions If you get watery poop while you are in the hospital or a long-term care facility, tell your doctor right away. When you visit someone in the hospital or a long-term care facility, wear a gown, gloves, or other protection. If possible: Stay away from people who have diarrhea. Use a separate bathroom if you are sick and live with other people. Clean environment Keep your home clean. Clean your home every day for at least a week after you leave the hospital. Clean surfaces that you touch every day. Use a product that has a 10% chlorine bleach solution. Be sure to: Read the label on your product to make sure that the product will kill the germs on your surfaces. Clean toilets and flush handles, bathtubs, sinks, doorknobs and handles, countertops, and work surfaces. If you are in the hospital, make sure the surfaces in your room  are cleaned each day. Tell someone right away if body fluids have splashed or spilled. Clothes and linens Wash clothes and linens using laundry soap that has chlorine bleach. Be sure to: Use powder soap instead of liquid. Clean your washing machine once a month. To do  this, turn on the hot setting with only soap in it. Contact a doctor if: Your symptoms do not get better or they get worse. Your symptoms go away and then come back. You have a fever. You have new symptoms. Get help right away if: Your belly is more tender or you have more pain. Your poop is mostly bloody. Your poop looks black. You vomit after you eat or drink. You have signs of not having enough fluids in your body. These include: Dark yellow pee, very little pee, or no pee. Cracked lips or dry mouth. No tears when you cry. Sunken eyes. Feeling sleepy. Feeling weak or dizzy. Summary C. diff infection is an infection that may happen after you take antibiotic medicines. Symptoms include watery poop, fever, not feeling hungry, or feeling like you may vomit. Treatment includes stopping the antibiotics that made you sick and taking antibiotics that kill the C. diff germs. Poop from a healthy person may also be placed into your colon. To prevent C. diff infectionfrom spreading, wash hands often with soap and water. Do this for at least 20 seconds. Keep your home clean. This information is not intended to replace advice given to you by your health care provider. Make sure you discuss any questions you have with your health care provider. Your provider has requested that you go to the basement level for lab work before leaving today. Press "B" on the elevator. The lab is located at the first door on the left as you exit the elevator.  Document Revised: 07/26/2019 Document Reviewed: 07/26/2019 Elsevier Patient Education  2023 [employer].

## 2022-01-15 ENCOUNTER — Other Ambulatory Visit: Payer: BC Managed Care – PPO

## 2022-01-15 DIAGNOSIS — K2970 Gastritis, unspecified, without bleeding: Secondary | ICD-10-CM | POA: Diagnosis not present

## 2022-01-15 DIAGNOSIS — B9681 Helicobacter pylori [H. pylori] as the cause of diseases classified elsewhere: Secondary | ICD-10-CM | POA: Diagnosis not present

## 2022-01-18 ENCOUNTER — Ambulatory Visit: Payer: BC Managed Care – PPO | Admitting: Physician Assistant

## 2022-01-18 LAB — HELICOBACTER PYLORI  SPECIAL ANTIGEN
MICRO NUMBER:: 13987101
SPECIMEN QUALITY: ADEQUATE

## 2022-02-17 ENCOUNTER — Other Ambulatory Visit: Payer: Self-pay

## 2022-02-17 ENCOUNTER — Other Ambulatory Visit: Payer: BC Managed Care – PPO

## 2022-02-17 DIAGNOSIS — Z0000 Encounter for general adult medical examination without abnormal findings: Secondary | ICD-10-CM

## 2022-02-17 DIAGNOSIS — Z130 Encounter for screening for diseases of the blood and blood-forming organs and certain disorders involving the immune mechanism: Secondary | ICD-10-CM

## 2022-02-17 DIAGNOSIS — Z1329 Encounter for screening for other suspected endocrine disorder: Secondary | ICD-10-CM | POA: Diagnosis not present

## 2022-02-17 DIAGNOSIS — Z13220 Encounter for screening for lipoid disorders: Secondary | ICD-10-CM | POA: Diagnosis not present

## 2022-02-17 DIAGNOSIS — Z131 Encounter for screening for diabetes mellitus: Secondary | ICD-10-CM | POA: Diagnosis not present

## 2022-02-17 NOTE — Progress Notes (Unsigned)
Complete physical exam   Patient: [name]   DOB: 1970-05-24   51 y.o. Female  MRN: [id number] Visit Date: 02/18/2022   No chief complaint on file.  Subjective    [name] is a 51 y.o. female who presents today for a complete physical exam.  She reports consuming a {diet types:17450} diet. {Exercise:19826} She generally feels {well/fairly well/poorly:18703}. She {does/does not:200015} have additional problems to discuss today.   ***  Past Medical History:  Diagnosis Date   Acute appendicitis 2012   Family history of breast cancer    Family history of breast cancer    Family history of colon cancer    Family history of uterine cancer    Past Surgical History:  Procedure Laterality Date   APPENDECTOMY     CESAREAN SECTION  1992, 2005   COLONOSCOPY  2021   LAPAROSCOPIC APPENDECTOMY  2012   UPPER GASTROINTESTINAL ENDOSCOPY     Social History   Socioeconomic History   Marital status: Married    Spouse name: Not on file   Number of children: 2   Years of education: Not on file   Highest education level: Not on file  Occupational History   Occupation: Advertising account planner: [employer]  Tobacco Use   Smoking status: Never   Smokeless tobacco: Never  Vaping Use   Vaping Use: Never used  Substance and Sexual Activity   Alcohol use: No   Drug use: No   Sexual activity: Yes    Birth control/protection: I.U.D.  Other Topics Concern   Not on file  Social History Narrative   Not on file   Social Determinants of Health   Financial Resource Strain: Not on file  Food Insecurity: Not on file  Transportation Needs: Not on file  Physical Activity: Not on file  Stress: Not on file  Social Connections: Not on file  Intimate Partner Violence: Not on file     Medications: Outpatient Medications Prior to Visit  Medication Sig   Cholecalciferol (VITAMIN D3) 2000 units TABS Take 1 capsule by mouth daily.   famotidine (PEPCID) 20 MG tablet Take 1  tablet (20 mg total) by mouth 2 (two) times daily. (Patient not taking: Reported on 01/13/2022)   fidaxomicin (DIFICID) 200 MG TABS tablet Take 1 tablet (200 mg total) by mouth 2 (two) times daily. (Patient not taking: Reported on 01/13/2022)   gabapentin (NEURONTIN) 100 MG capsule TAKE 1 OR 2 CAPSULES BY MOUTH EVERY NIGHT AT BEDTIME AS NEEDED FOR RESTLESS LEG SYNDROME   ibuprofen (ADVIL,MOTRIN) 200 MG tablet Take 200 mg by mouth as needed.   levonorgestrel (MIRENA) 20 MCG/24HR IUD 1 each by Intrauterine route once.   Multiple Vitamins-Minerals (ADULT ONE DAILY GUMMIES PO) Take 1 each by mouth daily.   Omega-3 Fatty Acids (FISH OIL PO) Take by mouth.   ondansetron (ZOFRAN) 4 MG tablet Take 1 tablet (4 mg total) by mouth every 6 (six) hours. (Patient not taking: Reported on 01/13/2022)   Probiotic Product (ALIGN) 4 MG CAPS Take 1 capsule (4 mg total) by mouth 2 (two) times daily.   promethazine-dextromethorphan (PROMETHAZINE-DM) 6.25-15 MG/5ML syrup Take 5 mLs by mouth 4 (four) times daily as needed for cough.   sucralfate (CARAFATE) 1 GM/10ML suspension Take 10 mLs (1 g total) by mouth 4 (four) times daily -  with meals and at bedtime. (Patient not taking: Reported on 01/13/2022)   traZODone (DESYREL) 50 MG tablet TAKE 1/2 TO 1 TABLET(25 TO  50 MG) BY MOUTH AT BEDTIME AS NEEDED FOR SLEEP (Patient not taking: Reported on 01/13/2022)   vitamin B-12 (CYANOCOBALAMIN) 1000 MCG tablet Take 1,000 mcg by mouth daily.   No facility-administered medications prior to visit.    Review of Systems Review of Systems:  A fourteen system review of systems was performed and found to be positive as per HPI.  Last CBC Lab Results  Component Value Date   WBC 4.7 01/13/2022   HGB 12.7 01/13/2022   HCT 37.0 01/13/2022   MCV 94.4 01/13/2022   MCH 32.0 12/27/2021   RDW 14.3 01/13/2022   PLT 181.0 01/13/2022   Last metabolic panel Lab Results  Component Value Date   GLUCOSE 139 (H) 12/27/2021   NA 141 12/27/2021    K 3.9 12/27/2021   CL 103 12/27/2021   CO2 28 12/27/2021   BUN 7 12/27/2021   CREATININE 0.82 12/27/2021   GFRNONAA >60 12/27/2021   CALCIUM 9.0 12/27/2021   PROT 7.4 12/27/2021   ALBUMIN 3.7 12/27/2021   LABGLOB 1.9 02/06/2021   AGRATIO 2.3 (H) 02/06/2021   BILITOT 0.5 12/27/2021   ALKPHOS 97 12/27/2021   AST 17 12/27/2021   ALT 26 12/27/2021   ANIONGAP 10 12/27/2021   Last lipids Lab Results  Component Value Date   CHOL 179 02/06/2021   HDL 64 02/06/2021   LDLCALC 108 (H) 02/06/2021   TRIG 35 02/06/2021   CHOLHDL 2.8 02/06/2021   Last hemoglobin A1c Lab Results  Component Value Date   HGBA1C 5.7 (H) 02/06/2021   Last thyroid functions Lab Results  Component Value Date   TSH 3.250 02/06/2021   Last vitamin D Lab Results  Component Value Date   VD25OH 23.6 (L) 01/30/2020      Objective    There were no vitals taken for this visit. BP Readings from Last 3 Encounters:  01/13/22 102/72  12/27/21 97/63  12/25/21 101/67   Wt Readings from Last 3 Encounters:  01/13/22 161 lb (73 kg)  10/13/21 168 lb (76.2 kg)  10/06/21 168 lb (76.2 kg)      Physical Exam   General Appearance:      Alert, cooperative, in no acute distress, appears stated age   Head:    Normocephalic, without obvious abnormality, atraumatic  Eyes:    PERRL, conjunctiva/corneas clear, EOM's intact, fundi    benign, both eyes  Ears:    Normal TM's and external ear canals, both ears  Nose:   Nares normal, septum midline, mucosa normal, no drainage    or sinus tenderness  Throat:   Lips, mucosa, and tongue normal; teeth and gums normal  Neck:   Supple, symmetrical, trachea midline, no adenopathy;    thyroid:  no enlargement/tenderness/nodules; no JVD  Back:     Symmetric, no curvature, ROM normal, no CVA tenderness  Lungs:     Clear to auscultation bilaterally, respirations unlabored  Chest Wall:    No tenderness or deformity   Heart:    Normal heart rate. Normal rhythm. No murmurs,  rubs, or gallops.   Breast Exam:    {Exam; breast:13139::"deferred"}  Abdomen:     Soft, non-tender, bowel sounds active all four quadrants,    no masses, no organomegaly  Pelvic:    {pelvic exam:16852::"deferred"}  Extremities:   All extremities are intact. No cyanosis or edema  Pulses:   2+ and symmetric all extremities  Skin:   Skin color, texture, turgor normal, no rashes or suspicous lesions  Lymph nodes:  Cervical, supraclavicular nodes normal  Neurologic:   CNII-XII grossly intact.     Last depression screening scores    02/10/2021    8:48 AM 02/07/2020    8:33 AM 02/07/2019   11:08 AM  PHQ 2/9 Scores  PHQ - 2 Score 0 0 0  PHQ- 9 Score 2 0 0   Last fall risk screening    02/10/2021    8:48 AM  Fall Risk   Falls in the past year? 0  Number falls in past yr: 0  Injury with Fall? 0  Follow up Falls evaluation completed     No results found for any visits on 02/18/22.  Assessment & Plan    Routine Health Maintenance and Physical Exam  Exercise Activities and Dietary recommendations -Discussed heart healthy diet low in fat and carbohydrates. Recommend moderate exercise 150 mins/wk.  Immunization History  Administered Date(s) Administered   PFIZER(Purple Top)SARS-COV-2 Vaccination 07/21/2020, 08/11/2020   Tdap 02/07/2019    Health Maintenance  Topic Date Due   Zoster Vaccines- Shingrix (1 of 2) Never done   PAP SMEAR-Modifier  04/28/2020   COVID-19 Vaccine (3 - Pfizer series) 10/06/2020   INFLUENZA VACCINE  Never done   MAMMOGRAM  03/18/2023   COLONOSCOPY (Pts 45-32yrs Insurance coverage will need to be confirmed)  04/15/2025   TETANUS/TDAP  02/06/2029   Hepatitis C Screening  Completed   HIV Screening  Completed   HPV VACCINES  Aged Out    Discussed health benefits of physical activity, and encouraged her to engage in regular exercise appropriate for her age and condition.  Problem List Items Addressed This Visit       Other   Healthcare  maintenance - Primary   Discussed with patient most recent lab results which are essentially within normal limits or stable from prior. Flu vaccine, shingles Due pap UTD tdap, colonoscopy and mammogram.  No follow-ups on file.       Mayer Masker, PA-C  [facility] at [facility] [phone] (phone) [phone] (fax)  [facility]

## 2022-02-18 ENCOUNTER — Encounter: Payer: Self-pay | Admitting: Physician Assistant

## 2022-02-18 ENCOUNTER — Ambulatory Visit (INDEPENDENT_AMBULATORY_CARE_PROVIDER_SITE_OTHER): Payer: BC Managed Care – PPO | Admitting: Physician Assistant

## 2022-02-18 VITALS — BP 99/62 | HR 72 | Temp 97.8°F | Resp 18 | Ht 67.32 in | Wt 160.8 lb

## 2022-02-18 DIAGNOSIS — E7800 Pure hypercholesterolemia, unspecified: Secondary | ICD-10-CM | POA: Diagnosis not present

## 2022-02-18 DIAGNOSIS — Z0000 Encounter for general adult medical examination without abnormal findings: Secondary | ICD-10-CM | POA: Diagnosis not present

## 2022-02-18 DIAGNOSIS — G2581 Restless legs syndrome: Secondary | ICD-10-CM | POA: Diagnosis not present

## 2022-02-18 LAB — COMPREHENSIVE METABOLIC PANEL
ALT: 14 IU/L (ref 0–32)
AST: 15 IU/L (ref 0–40)
Albumin/Globulin Ratio: 1.7 (ref 1.2–2.2)
Albumin: 4.2 g/dL (ref 3.8–4.9)
Alkaline Phosphatase: 95 IU/L (ref 44–121)
BUN/Creatinine Ratio: 19 (ref 9–23)
BUN: 15 mg/dL (ref 6–24)
Bilirubin Total: 0.6 mg/dL (ref 0.0–1.2)
CO2: 27 mmol/L (ref 20–29)
Calcium: 9.3 mg/dL (ref 8.7–10.2)
Chloride: 103 mmol/L (ref 96–106)
Creatinine, Ser: 0.78 mg/dL (ref 0.57–1.00)
Globulin, Total: 2.5 g/dL (ref 1.5–4.5)
Glucose: 98 mg/dL (ref 70–99)
Potassium: 4.5 mmol/L (ref 3.5–5.2)
Sodium: 142 mmol/L (ref 134–144)
Total Protein: 6.7 g/dL (ref 6.0–8.5)
eGFR: 91 mL/min/{1.73_m2} (ref >59)

## 2022-02-18 LAB — CBC WITH DIFFERENTIAL/PLATELET
Basophils Absolute: 0 10*3/uL (ref 0.0–0.2)
Basos: 0 %
EOS (ABSOLUTE): 0.1 10*3/uL (ref 0.0–0.4)
Eos: 3 %
Hematocrit: 35.9 % (ref 34.0–46.6)
Hemoglobin: 12.4 g/dL (ref 11.1–15.9)
Immature Grans (Abs): 0 10*3/uL (ref 0.0–0.1)
Immature Granulocytes: 0 %
Lymphocytes Absolute: 1.3 10*3/uL (ref 0.7–3.1)
Lymphs: 37 %
MCH: 32.2 pg (ref 26.6–33.0)
MCHC: 34.5 g/dL (ref 31.5–35.7)
MCV: 93 fL (ref 79–97)
Monocytes Absolute: 0.2 10*3/uL (ref 0.1–0.9)
Monocytes: 7 %
Neutrophils Absolute: 1.9 10*3/uL (ref 1.4–7.0)
Neutrophils: 53 %
Platelets: 157 10*3/uL (ref 150–450)
RBC: 3.85 x10E6/uL (ref 3.77–5.28)
RDW: 12.8 % (ref 11.7–15.4)
WBC: 3.6 10*3/uL (ref 3.4–10.8)

## 2022-02-18 LAB — LIPID PANEL
Chol/HDL Ratio: 2.7 ratio (ref 0.0–4.4)
Cholesterol, Total: 167 mg/dL (ref 100–199)
HDL: 62 mg/dL (ref >39)
LDL Chol Calc (NIH): 98 mg/dL (ref 0–99)
Triglycerides: 29 mg/dL (ref 0–149)
VLDL Cholesterol Cal: 7 mg/dL (ref 5–40)

## 2022-02-18 LAB — TSH: TSH: 3.22 u[IU]/mL (ref 0.450–4.500)

## 2022-02-18 LAB — HEMOGLOBIN A1C
Est. average glucose Bld gHb Est-mCnc: 114 mg/dL
Hgb A1c MFr Bld: 5.6 % (ref 4.8–5.6)

## 2022-02-18 MED ORDER — GABAPENTIN 100 MG PO CAPS: ORAL_CAPSULE | ORAL | 0 refills | Status: DC

## 2022-02-18 NOTE — Patient Instructions (Signed)
Preventive Care 40-52 Years Old, Female Preventive care refers to lifestyle choices and visits with your health care provider that can promote health and wellness. Preventive care visits are also called wellness exams. What can I expect for my preventive care visit? Counseling Your health care provider may ask you questions about your: Medical history, including: Past medical problems. Family medical history. Pregnancy history. Current health, including: Menstrual cycle. Method of birth control. Emotional well-being. Home life and relationship well-being. Sexual activity and sexual health. Lifestyle, including: Alcohol, nicotine or tobacco, and drug use. Access to firearms. Diet, exercise, and sleep habits. Work and work environment. Sunscreen use. Safety issues such as seatbelt and bike helmet use. Physical exam Your health care provider will check your: Height and weight. These may be used to calculate your BMI (body mass index). BMI is a measurement that tells if you are at a healthy weight. Waist circumference. This measures the distance around your waistline. This measurement also tells if you are at a healthy weight and may help predict your risk of certain diseases, such as type 2 diabetes and high blood pressure. Heart rate and blood pressure. Body temperature. Skin for abnormal spots. What immunizations do I need?  Vaccines are usually given at various ages, according to a schedule. Your health care provider will recommend vaccines for you based on your age, medical history, and lifestyle or other factors, such as travel or where you work. What tests do I need? Screening Your health care provider may recommend screening tests for certain conditions. This may include: Lipid and cholesterol levels. Diabetes screening. This is done by checking your blood sugar (glucose) after you have not eaten for a while (fasting). Pelvic exam and Pap test. Hepatitis B test. Hepatitis C  test. HIV (human immunodeficiency virus) test. STI (sexually transmitted infection) testing, if you are at risk. Lung cancer screening. Colorectal cancer screening. Mammogram. Talk with your health care provider about when you should start having regular mammograms. This may depend on whether you have a family history of breast cancer. BRCA-related cancer screening. This may be done if you have a family history of breast, ovarian, tubal, or peritoneal cancers. Bone density scan. This is done to screen for osteoporosis. Talk with your health care provider about your test results, treatment options, and if necessary, the need for more tests. Follow these instructions at home: Eating and drinking  Eat a diet that includes fresh fruits and vegetables, whole grains, lean protein, and low-fat dairy products. Take vitamin and mineral supplements as recommended by your health care provider. Do not drink alcohol if: Your health care provider tells you not to drink. You are pregnant, may be pregnant, or are planning to become pregnant. If you drink alcohol: Limit how much you have to 0-1 drink a day. Know how much alcohol is in your drink. In the [place] one drink equals one 12 oz bottle of beer (355 mL), one 5 oz glass of wine (148 mL), or one 1 oz glass of hard liquor (44 mL). Lifestyle Brush your teeth every morning and night with fluoride toothpaste. Floss one time each day. Exercise for at least 30 minutes 5 or more days each week. Do not use any products that contain nicotine or tobacco. These products include cigarettes, chewing tobacco, and vaping devices, such as e-cigarettes. If you need help quitting, ask your health care provider. Do not use drugs. If you are sexually active, practice safe sex. Use a condom or other form of protection to   prevent STIs. If you do not wish to become pregnant, use a form of birth control. If you plan to become pregnant, see your health care provider for a  prepregnancy visit. Take aspirin only as told by your health care provider. Make sure that you understand how much to take and what form to take. Work with your health care provider to find out whether it is safe and beneficial for you to take aspirin daily. Find healthy ways to manage stress, such as: Meditation, yoga, or listening to music. Journaling. Talking to a trusted person. Spending time with friends and family. Minimize exposure to UV radiation to reduce your risk of skin cancer. Safety Always wear your seat belt while driving or riding in a vehicle. Do not drive: If you have been drinking alcohol. Do not ride with someone who has been drinking. When you are tired or distracted. While texting. If you have been using any mind-altering substances or drugs. Wear a helmet and other protective equipment during sports activities. If you have firearms in your house, make sure you follow all gun safety procedures. Seek help if you have been physically or sexually abused. What's next? Visit your health care provider once a year for an annual wellness visit. Ask your health care provider how often you should have your eyes and teeth checked. Stay up to date on all vaccines. This information is not intended to replace advice given to you by your health care provider. Make sure you discuss any questions you have with your health care provider. Document Revised: 10/01/2020 Document Reviewed: 10/01/2020 Elsevier Patient Education  [place].

## 2022-02-23 ENCOUNTER — Encounter: Payer: Self-pay | Admitting: Physician Assistant

## 2022-03-05 DIAGNOSIS — H6993 Unspecified Eustachian tube disorder, bilateral: Secondary | ICD-10-CM | POA: Diagnosis not present

## 2022-03-05 DIAGNOSIS — H9203 Otalgia, bilateral: Secondary | ICD-10-CM | POA: Diagnosis not present

## 2022-03-25 ENCOUNTER — Ambulatory Visit
Admission: EM | Admit: 2022-03-25 | Discharge: 2022-03-25 | Disposition: A | Discharge status: Home / Self Care | Payer: BC Managed Care – PPO | Attending: Internal Medicine | Admitting: Internal Medicine

## 2022-03-25 ENCOUNTER — Encounter: Payer: Self-pay | Admitting: Emergency Medicine

## 2022-03-25 DIAGNOSIS — Z1152 Encounter for screening for COVID-19: Secondary | ICD-10-CM | POA: Diagnosis not present

## 2022-03-25 DIAGNOSIS — J069 Acute upper respiratory infection, unspecified: Secondary | ICD-10-CM | POA: Insufficient documentation

## 2022-03-25 LAB — RESP PANEL BY RT-PCR (FLU A&B, COVID) ARPGX2
Influenza A by PCR: POSITIVE — AB
Influenza B by PCR: NEGATIVE
SARS Coronavirus 2 by RT PCR: NEGATIVE

## 2022-03-25 MED ORDER — BENZONATATE 100 MG PO CAPS: 100.0000 mg | ORAL_CAPSULE | Freq: Three times a day (TID) | ORAL | 0 refills | Status: DC | PRN

## 2022-03-25 MED ORDER — PROMETHAZINE-DM 6.25-15 MG/5ML PO SYRP: 5.0000 mL | ORAL_SOLUTION | Freq: Every evening | ORAL | 0 refills | Status: DC | PRN

## 2022-03-25 NOTE — Discharge Instructions (Signed)
You have a viral upper respiratory infection which should run its course and self resolve with symptomatic treatment as we discussed.  I have prescribed you 2 different cough medications.  Please be advised that Promethazine DM can cause drowsiness.  Do not take this with gabapentin as it can cause excessive drowsiness.  COVID and flu test is pending.  We will call if it is positive.  Also recommend that you start taking Flonase that you have at home as well.  Follow-up if symptoms persist or worsen.

## 2022-03-25 NOTE — ED Triage Notes (Signed)
Pt is present today with c/o cough, chest congestion, fever, HA, chills, and body aches x2 days. Pt states that her fever was 100 this morning and she took tylenol around 7:30am.

## 2022-03-25 NOTE — ED Provider Notes (Signed)
[name]    CSN: [id number] Arrival date & time: 03/25/22  6256      History   Chief Complaint Chief Complaint  Patient presents with   Fever   Cough   Chills   Generalized Body Aches   Headache    HPI [name] is a 51 y.o. female.   Patient presents with cough, feelings of chest congestion, fever, headache, chills, generalized body aches, nasal congestion that started 2 days ago.  Patient denies any known sick contacts.  Tmax at home was 102.  She has taken over-the-counter cold and flu medications as well as Tylenol with minimal improvement of symptoms.  Denies shortness of breath, sore throat, ear pain, nausea, vomiting, diarrhea, abdominal pain.  Patient denies formal diagnosis of asthma or COPD.   Fever Cough Headache   Past Medical History:  Diagnosis Date   Acute appendicitis 2012   Family history of breast cancer    Family history of breast cancer    Family history of colon cancer    Family history of uterine cancer     Patient Active Problem List   Diagnosis Date Noted   Genetic testing 05/05/2020   Family history of breast cancer    Family history of colon cancer    Family history of uterine cancer    Thrombocytopenia (HCC) 02/29/2020   Chronic bilateral thoracic back pain 05/30/2018   Epigastric pain 05/30/2018   Restless legs syndrome (RLS) 01/16/2018   TMJ (temporomandibular joint syndrome) 08/24/2017   Healthcare maintenance 12/15/2016   PAC (premature atrial contraction) 07/12/2011   DIZZINESS 12/03/2008   TIREDNESS 12/03/2008   PALPITATIONS 12/03/2008    Past Surgical History:  Procedure Laterality Date   APPENDECTOMY     CESAREAN SECTION  1992, 2005   COLONOSCOPY  2021   LAPAROSCOPIC APPENDECTOMY  2012   UPPER GASTROINTESTINAL ENDOSCOPY      OB History   No obstetric history on file.      Home Medications    Prior to Admission medications   Medication Sig Start Date End Date Taking? Authorizing  Provider  benzonatate (TESSALON) 100 MG capsule Take 1 capsule (100 mg total) by mouth every 8 (eight) hours as needed for cough. 03/25/22  Yes , Acie Fredrickson, FNP  promethazine-dextromethorphan (PROMETHAZINE-DM) 6.25-15 MG/5ML syrup Take 5 mLs by mouth at bedtime as needed for cough. 03/25/22  Yes , Rolly Salter E, FNP  Cholecalciferol (VITAMIN D3) 2000 units TABS Take 1 capsule by mouth daily.    [provider]  famotidine (PEPCID) 20 MG tablet Take 1 tablet (20 mg total) by mouth 2 (two) times daily. Patient not taking: Reported on 01/13/2022 12/24/21   Edwin Dada P, DO  fidaxomicin (DIFICID) 200 MG TABS tablet Take 1 tablet (200 mg total) by mouth 2 (two) times daily. Patient not taking: Reported on 01/13/2022 01/01/22   Mansouraty, Netty Starring., MD  gabapentin (NEURONTIN) 100 MG capsule TAKE 1 OR 2 CAPSULES BY MOUTH EVERY NIGHT AT BEDTIME AS NEEDED FOR RESTLESS LEG SYNDROME 02/18/22   Mayer Masker, PA-C  ibuprofen (ADVIL,MOTRIN) 200 MG tablet Take 200 mg by mouth as needed.    [provider]  levonorgestrel (MIRENA) 20 MCG/24HR IUD 1 each by Intrauterine route once.    [provider]  Multiple Vitamins-Minerals (ADULT ONE DAILY GUMMIES PO) Take 1 each by mouth daily.    [provider]  Omega-3 Fatty Acids (FISH OIL PO) Take by mouth.    [provider]  ondansetron (ZOFRAN) 4 MG tablet Take 1 tablet (4 mg total) by mouth every 6 (six) hours. Patient not taking: Reported on 01/13/2022 12/27/21   Jacalyn Lefevre, MD  Probiotic Product (ALIGN) 4 MG CAPS Take 1 capsule (4 mg total) by mouth 2 (two) times daily. 12/27/21   Jacalyn Lefevre, MD  sucralfate (CARAFATE) 1 GM/10ML suspension Take 10 mLs (1 g total) by mouth 4 (four) times daily -  with meals and at bedtime. Patient not taking: Reported on 01/13/2022 12/24/21   Edwin Dada P, DO  traZODone (DESYREL) 50 MG tablet TAKE 1/2 TO 1 TABLET(25 TO 50 MG) BY MOUTH AT BEDTIME AS NEEDED FOR SLEEP Patient not  taking: Reported on 01/13/2022 05/12/21   Mayer Masker, PA-C  vitamin B-12 (CYANOCOBALAMIN) 1000 MCG tablet Take 1,000 mcg by mouth daily.    [provider]    Family History Family History  Problem Relation Age of Onset   Cancer Sister        Breast and brain   Breast cancer Sister 93   Cancer Mother        colon   Hypertension Mother    Colon cancer Mother 76   Hyperlipidemia Father    Hypertension Father    Hypothyroidism Father    Healthy Son    Alcohol abuse Maternal Uncle    Cancer Maternal Grandmother        lung   Kidney disease Maternal Grandfather    Heart disease Paternal Grandmother    Pneumonia Paternal Grandfather    Hypothyroidism Sister    Uterine cancer Sister 55   Healthy Son    Colon cancer Paternal Uncle        d. 70   Colon polyps Neg Hx    Esophageal cancer Neg Hx    Rectal cancer Neg Hx    Stomach cancer Neg Hx     Social History Social History   Tobacco Use   Smoking status: Never   Smokeless tobacco: Never  Vaping Use   Vaping Use: Never used  Substance Use Topics   Alcohol use: No   Drug use: No     Allergies   Vancomycin   Review of Systems Review of Systems Per HPI  Physical Exam Triage Vital Signs ED Triage Vitals  Enc Vitals Group     BP 03/25/22 1008 104/70     Pulse Rate 03/25/22 1008 85     Resp 03/25/22 1008 18     Temp 03/25/22 1008 98.2 F (36.8 C)     Temp src --      SpO2 03/25/22 1008 98 %     Weight --      Height --      Head Circumference --      Peak Flow --      Pain Score 03/25/22 1006 7     Pain Loc --      Pain Edu? --      Excl. in GC? --    No data found.  Updated Vital Signs BP 104/70   Pulse 85   Temp 98.2 F (36.8 C)   Resp 18   SpO2 98%   Visual Acuity Right Eye Distance:   Left Eye Distance:   Bilateral Distance:    Right Eye Near:   Left Eye Near:    Bilateral Near:     Physical Exam Constitutional:      General: She is not in acute distress.     Appearance: Normal  appearance. She is not toxic-appearing or diaphoretic.  HENT:     Head: Normocephalic and atraumatic.     Right Ear: Tympanic membrane and ear canal normal.     Left Ear: Tympanic membrane and ear canal normal.     Nose: Congestion present.     Mouth/Throat:     Mouth: Mucous membranes are moist.     Pharynx: Posterior oropharyngeal erythema present.  Eyes:     Extraocular Movements: Extraocular movements intact.     Conjunctiva/sclera: Conjunctivae normal.     Pupils: Pupils are equal, round, and reactive to light.  Cardiovascular:     Rate and Rhythm: Normal rate and regular rhythm.     Pulses: Normal pulses.     Heart sounds: Normal heart sounds.  Pulmonary:     Effort: Pulmonary effort is normal. No respiratory distress.     Breath sounds: Normal breath sounds. No stridor. No wheezing, rhonchi or rales.  Abdominal:     General: Abdomen is flat. Bowel sounds are normal.     Palpations: Abdomen is soft.  Musculoskeletal:        General: Normal range of motion.     Cervical back: Normal range of motion.  Skin:    General: Skin is warm and dry.  Neurological:     General: No focal deficit present.     Mental Status: She is alert and oriented to person, place, and time. Mental status is at baseline.  Psychiatric:        Mood and Affect: Mood normal.        Behavior: Behavior normal.      UC Treatments / Results  Labs (all labs ordered are listed, but only abnormal results are displayed) Labs Reviewed  RESP PANEL BY RT-PCR (FLU A&B, COVID) [id number]    EKG   Radiology No results found.  Procedures Procedures (including critical care time)  Medications Ordered in UC Medications - No data to display  Initial Impression / Assessment and Plan / UC Course  I have reviewed the triage vital signs and the nursing notes.  Pertinent labs & imaging results that were available during my care of the patient were reviewed by me and considered in my medical  decision making (see chart for details).     Patient presents with symptoms likely from a viral upper respiratory infection. Differential includes bacterial pneumonia, sinusitis, allergic rhinitis, COVID-19, flu, RSV. Do not suspect underlying cardiopulmonary process. Symptoms seem unlikely related to ACS, CHF or COPD exacerbations, pneumonia, pneumothorax. Patient is nontoxic appearing and not in need of emergent medical intervention.  COVID and flu test pending.  If COVID test is positive and patient is inside the treatment window, patient may take Paxlovid given that last GFR is 91 if she desires.  Recommended symptom control with occasions and supportive care.  Patient sent prescriptions.  Advised Flonase as well but patient reports that she has this at home.  Advised patient Promethazine DM can cause drowsiness.  Patient denies that she takes any daily medications so this should be safe.  Return if symptoms fail to improve in 1-2 weeks or you develop shortness of breath, chest pain, severe headache. Patient states understanding and is agreeable.  Discharged with PCP followup.  Final Clinical Impressions(s) / UC Diagnoses   Final diagnoses:  Viral upper respiratory tract infection with cough     Discharge Instructions      You have a viral upper respiratory infection which should run its course and self  resolve with symptomatic treatment as we discussed.  I have prescribed you 2 different cough medications.  Please be advised that Promethazine DM can cause drowsiness.  Do not take this with gabapentin as it can cause excessive drowsiness.  COVID and flu test is pending.  We will call if it is positive.  Also recommend that you start taking Flonase that you have at home as well.  Follow-up if symptoms persist or worsen.    ED Prescriptions     Medication Sig Dispense Auth. Provider   promethazine-dextromethorphan (PROMETHAZINE-DM) 6.25-15 MG/5ML syrup Take 5 mLs by mouth at bedtime as  needed for cough. 118 mL , [place] Harbor[place]aley E, [place]   benzonatate (TESSALON) 100 MG capsule Take 1 capsule (100 mg total) by mouth every 8 (eight) hours as needed for cough. 21 capsule [place], Acie FredricksonHaley E, [place]NP      PDMP not reviewed this encounter.   Gustavus Bryant, Haylo Fake E, [place]NP 03/25/22 1044

## 2022-04-01 DIAGNOSIS — H9012 Conductive hearing loss, unilateral, left ear, with unrestricted hearing on the contralateral side: Secondary | ICD-10-CM | POA: Diagnosis not present

## 2022-04-01 DIAGNOSIS — H6982 Other specified disorders of Eustachian tube, left ear: Secondary | ICD-10-CM | POA: Diagnosis not present

## 2022-04-01 DIAGNOSIS — J310 Chronic rhinitis: Secondary | ICD-10-CM | POA: Diagnosis not present

## 2022-04-01 DIAGNOSIS — H6522 Chronic serous otitis media, left ear: Secondary | ICD-10-CM | POA: Diagnosis not present

## 2022-04-09 ENCOUNTER — Ambulatory Visit (INDEPENDENT_AMBULATORY_CARE_PROVIDER_SITE_OTHER): Payer: BC Managed Care – PPO

## 2022-04-09 ENCOUNTER — Ambulatory Visit
Admission: EM | Admit: 2022-04-09 | Discharge: 2022-04-09 | Disposition: A | Discharge status: Home / Self Care | Payer: BC Managed Care – PPO | Attending: Physician Assistant | Admitting: Physician Assistant

## 2022-04-09 DIAGNOSIS — R0781 Pleurodynia: Secondary | ICD-10-CM | POA: Diagnosis not present

## 2022-04-09 DIAGNOSIS — R079 Chest pain, unspecified: Secondary | ICD-10-CM

## 2022-04-09 MED ORDER — PREDNISONE 20 MG PO TABS: 40.0000 mg | ORAL_TABLET | Freq: Every day | ORAL | 0 refills | Status: AC

## 2022-04-09 MED ORDER — CYCLOBENZAPRINE HCL 10 MG PO TABS: 10.0000 mg | ORAL_TABLET | Freq: Two times a day (BID) | ORAL | 0 refills | Status: DC | PRN

## 2022-04-09 NOTE — ED Provider Notes (Signed)
[name]    CSN: [id number] Arrival date & time: 04/09/22  1513      History   Chief Complaint Chief Complaint  Patient presents with   side pain     HPI [name] is a 51 y.o. female.   Patient here today for evaluation of left-sided middle anterior and lateral rib pain that started about a week ago after having cough. She reports her cough has improved but she still has pain with deep breathing and movement. She denies any shortness of breath. She has not had any vomiting. She denies fever. She has tried tylenol with minimal improvement.   The history is provided by the patient.    Past Medical History:  Diagnosis Date   Acute appendicitis 2012   Family history of breast cancer    Family history of breast cancer    Family history of colon cancer    Family history of uterine cancer     Patient Active Problem List   Diagnosis Date Noted   Genetic testing 05/05/2020   Family history of breast cancer    Family history of colon cancer    Family history of uterine cancer    Thrombocytopenia ([place]) 02/29/2020   Chronic bilateral thoracic back pain 05/30/2018   Epigastric pain 05/30/2018   Restless legs syndrome (RLS) 01/16/2018   TMJ (temporomandibular joint syndrome) 08/24/2017   Healthcare maintenance 12/15/2016   PAC (premature atrial contraction) 07/12/2011   DIZZINESS 12/03/2008   TIREDNESS 12/03/2008   PALPITATIONS 12/03/2008    Past Surgical History:  Procedure Laterality Date   APPENDECTOMY     CESAREAN SECTION  1992, 2005   COLONOSCOPY  2021   LAPAROSCOPIC APPENDECTOMY  2012   UPPER GASTROINTESTINAL ENDOSCOPY      OB History   No obstetric history on file.      Home Medications    Prior to Admission medications   Medication Sig Start Date End Date Taking? Authorizing Provider  cyclobenzaprine (FLEXERIL) 10 MG tablet Take 1 tablet (10 mg total) by mouth 2 (two) times daily as needed for muscle spasms. 04/09/22  Yes Francene Finders, PA-C  predniSONE (DELTASONE) 20 MG tablet Take 2 tablets (40 mg total) by mouth daily with breakfast for 5 days. 04/09/22 04/14/22 Yes Francene Finders, PA-C  benzonatate (TESSALON) 100 MG capsule Take 1 capsule (100 mg total) by mouth every 8 (eight) hours as needed for cough. 03/25/22   Teodora Medici, FNP  Cholecalciferol (VITAMIN D3) 2000 units TABS Take 1 capsule by mouth daily.    [provider]  famotidine (PEPCID) 20 MG tablet Take 1 tablet (20 mg total) by mouth 2 (two) times daily. Patient not taking: Reported on 01/13/2022 [id number]   Campbell Stall P, DO  fidaxomicin (DIFICID) 200 MG TABS tablet Take 1 tablet (200 mg total) by mouth 2 (two) times daily. Patient not taking: Reported on 01/13/2022 01/01/22   Mansouraty, Telford Nab., MD  gabapentin (NEURONTIN) 100 MG capsule TAKE 1 OR 2 CAPSULES BY MOUTH EVERY NIGHT AT BEDTIME AS NEEDED FOR RESTLESS LEG SYNDROME 02/18/22   Lorrene Reid, PA-C  ibuprofen (ADVIL,MOTRIN) 200 MG tablet Take 200 mg by mouth as needed.    [provider]  levonorgestrel (MIRENA) 20 MCG/24HR IUD 1 each by Intrauterine route once.    [provider]  Multiple Vitamins-Minerals (ADULT ONE DAILY GUMMIES PO) Take 1 each by mouth daily.    [provider]  Omega-3 Fatty Acids (FISH OIL  PO) Take by mouth.    [provider]  ondansetron (ZOFRAN) 4 MG tablet Take 1 tablet (4 mg total) by mouth every 6 (six) hours. Patient not taking: Reported on 01/13/2022 12/27/21   Jacalyn Lefevre, MD  Probiotic Product (ALIGN) 4 MG CAPS Take 1 capsule (4 mg total) by mouth 2 (two) times daily. 12/27/21   Jacalyn Lefevre, MD  promethazine-dextromethorphan (PROMETHAZINE-DM) 6.25-15 MG/5ML syrup Take 5 mLs by mouth at bedtime as needed for cough. 03/25/22   Gustavus Bryant, FNP  sucralfate (CARAFATE) 1 GM/10ML suspension Take 10 mLs (1 g total) by mouth 4 (four) times daily -  with meals and at bedtime. Patient not taking: Reported on  01/13/2022 12/24/21   Edwin Dada P, DO  traZODone (DESYREL) 50 MG tablet TAKE 1/2 TO 1 TABLET(25 TO 50 MG) BY MOUTH AT BEDTIME AS NEEDED FOR SLEEP Patient not taking: Reported on 01/13/2022 05/12/21   Mayer Masker, PA-C  vitamin B-12 (CYANOCOBALAMIN) 1000 MCG tablet Take 1,000 mcg by mouth daily.    [provider]    Family History Family History  Problem Relation Age of Onset   Cancer Sister        Breast and brain   Breast cancer Sister 27   Cancer Mother        colon   Hypertension Mother    Colon cancer Mother 18   Hyperlipidemia Father    Hypertension Father    Hypothyroidism Father    Healthy Son    Alcohol abuse Maternal Uncle    Cancer Maternal Grandmother        lung   Kidney disease Maternal Grandfather    Heart disease Paternal Grandmother    Pneumonia Paternal Grandfather    Hypothyroidism Sister    Uterine cancer Sister 49   Healthy Son    Colon cancer Paternal Uncle        d. 13   Colon polyps Neg Hx    Esophageal cancer Neg Hx    Rectal cancer Neg Hx    Stomach cancer Neg Hx     Social History Social History   Tobacco Use   Smoking status: Never   Smokeless tobacco: Never  Vaping Use   Vaping Use: Never used  Substance Use Topics   Alcohol use: No   Drug use: No     Allergies   Vancomycin   Review of Systems Review of Systems  Constitutional:  Negative for chills and fever.  Eyes:  Negative for discharge and redness.  Respiratory:  Negative for shortness of breath.   Cardiovascular:  Positive for chest pain.  Gastrointestinal:  Negative for nausea and vomiting.     Physical Exam Triage Vital Signs ED Triage Vitals  Enc Vitals Group     BP 04/09/22 1637 99/63     Pulse Rate 04/09/22 1637 71     Resp 04/09/22 1637 16     Temp 04/09/22 1637 (!) 97.4 F (36.3 C)     Temp Source 04/09/22 1637 Oral     SpO2 04/09/22 1637 100 %     Weight --      Height --      Head Circumference --      Peak Flow --      Pain Score  04/09/22 1636 8     Pain Loc --      Pain Edu? --      Excl. in GC? --    No data found.  Updated Vital  Signs BP 99/63   Pulse 71   Temp (!) 97.4 F (36.3 C) (Oral)   Resp 16   SpO2 100%     Physical Exam Vitals and nursing note reviewed.  Constitutional:      General: She is not in acute distress.    Appearance: Normal appearance. She is not ill-appearing.  HENT:     Head: Normocephalic and atraumatic.  Eyes:     Conjunctiva/sclera: Conjunctivae normal.  Cardiovascular:     Rate and Rhythm: Normal rate and regular rhythm.  Pulmonary:     Effort: Pulmonary effort is normal. No respiratory distress.     Breath sounds: Normal breath sounds. No wheezing, rhonchi or rales.  Chest:     Chest wall: Tenderness (minimal TTP noted to mid ribs to left anterior, lateral side) present.  Neurological:     Mental Status: She is alert.  Psychiatric:        Mood and Affect: Mood normal.        Behavior: Behavior normal.        Thought Content: Thought content normal.      UC Treatments / Results  Labs (all labs ordered are listed, but only abnormal results are displayed) Labs Reviewed  POCT URINALYSIS DIP (MANUAL ENTRY)    EKG   Radiology DG Ribs Unilateral W/Chest Left  Result Date: 04/09/2022 CLINICAL DATA:  Pain EXAM: LEFT RIBS AND CHEST - 3 VIEW COMPARISON:  None Available. FINDINGS: No fracture or other bone lesions are seen involving the ribs. There is no evidence of pneumothorax or pleural effusion. Both lungs are clear. Heart size and mediastinal contours are within normal limits. IMPRESSION: Negative. Electronically Signed   By: Sammie Bench M.D.   On: 04/09/2022 16:53    Procedures Procedures (including critical care time)  Medications Ordered in UC Medications - No data to display  Initial Impression / Assessment and Plan / UC Course  I have reviewed the triage vital signs and the nursing notes.  Pertinent labs & imaging results that were available  during my care of the patient were reviewed by me and considered in my medical decision making (see chart for details).    Xray without fracture. Will treat with steroid burst and flexeril. Discussed muscle relaxer may cause drowsiness. Encouraged follow up with any further concerns. Advised intentional deep breathing exercises to prevent atelectasis/ pna. Patient expresses understanding.   Final Clinical Impressions(s) / UC Diagnoses   Final diagnoses:  Rib pain on left side   Discharge Instructions   None    ED Prescriptions     Medication Sig Dispense Auth. Provider   predniSONE (DELTASONE) 20 MG tablet Take 2 tablets (40 mg total) by mouth daily with breakfast for 5 days. 10 tablet Ewell Poe F, PA-C   cyclobenzaprine (FLEXERIL) 10 MG tablet Take 1 tablet (10 mg total) by mouth 2 (two) times daily as needed for muscle spasms. 20 tablet Francene Finders, PA-C      PDMP not reviewed this encounter.   Francene Finders, PA-C 04/09/22 1758

## 2022-04-09 NOTE — ED Triage Notes (Addendum)
Pt presents to uc with co of Left sided pain that is worse when she takes a deep breath. Pt reports she was recently sick with a cough but pain continues has attempted tylenol

## 2022-04-20 ENCOUNTER — Encounter: Payer: Self-pay | Admitting: Nurse Practitioner

## 2022-04-20 ENCOUNTER — Other Ambulatory Visit: Payer: Self-pay | Admitting: Nurse Practitioner

## 2022-04-20 DIAGNOSIS — Z1231 Encounter for screening mammogram for malignant neoplasm of breast: Secondary | ICD-10-CM

## 2022-04-20 DIAGNOSIS — R928 Other abnormal and inconclusive findings on diagnostic imaging of breast: Secondary | ICD-10-CM

## 2022-04-23 ENCOUNTER — Other Ambulatory Visit: Payer: Self-pay | Admitting: Nurse Practitioner

## 2022-04-23 ENCOUNTER — Ambulatory Visit
Admission: RE | Admit: 2022-04-23 | Discharge: 2022-04-23 | Disposition: A | Discharge status: Home / Self Care | Payer: BC Managed Care – PPO | Source: Ambulatory Visit | Attending: Nurse Practitioner | Admitting: Nurse Practitioner

## 2022-04-23 DIAGNOSIS — R928 Other abnormal and inconclusive findings on diagnostic imaging of breast: Secondary | ICD-10-CM

## 2022-04-23 DIAGNOSIS — R921 Mammographic calcification found on diagnostic imaging of breast: Secondary | ICD-10-CM | POA: Diagnosis not present

## 2022-04-23 DIAGNOSIS — Z1231 Encounter for screening mammogram for malignant neoplasm of breast: Secondary | ICD-10-CM

## 2022-08-03 DIAGNOSIS — M722 Plantar fascial fibromatosis: Secondary | ICD-10-CM | POA: Diagnosis not present

## 2022-09-16 ENCOUNTER — Telehealth: Payer: Self-pay

## 2022-09-16 NOTE — Telephone Encounter (Signed)
Pt was not interested in scheduling an appt.

## 2022-09-16 NOTE — Telephone Encounter (Signed)
Patient called to see if she could take 3 capsules of gabapentin as needed, please advise, thanks.

## 2022-09-16 NOTE — Telephone Encounter (Signed)
What is she needing to increase her gabapentin for?  It is safe to take 300 mg up to 3 times a day for certain conditions.

## 2022-09-16 NOTE — Telephone Encounter (Signed)
What is she needing to increase it for?  If she is needing an increase on her medication for a chronic condition I would recommend scheduling an appointment to discuss whether this is the most appropriate option.

## 2022-10-29 DIAGNOSIS — Z30432 Encounter for removal of intrauterine contraceptive device: Secondary | ICD-10-CM | POA: Diagnosis not present

## 2022-10-29 DIAGNOSIS — Z6826 Body mass index (BMI) 26.0-26.9, adult: Secondary | ICD-10-CM | POA: Diagnosis not present

## 2022-10-29 DIAGNOSIS — Z01419 Encounter for gynecological examination (general) (routine) without abnormal findings: Secondary | ICD-10-CM | POA: Diagnosis not present

## 2022-10-29 DIAGNOSIS — Z124 Encounter for screening for malignant neoplasm of cervix: Secondary | ICD-10-CM | POA: Diagnosis not present

## 2022-10-29 LAB — HM PAP SMEAR

## 2022-10-29 LAB — RESULTS CONSOLE HPV: CHL HPV: NEGATIVE

## 2023-02-06 ENCOUNTER — Encounter: Payer: Self-pay | Admitting: Family Medicine

## 2023-02-06 DIAGNOSIS — G2581 Restless legs syndrome: Secondary | ICD-10-CM

## 2023-02-07 MED ORDER — GABAPENTIN 100 MG PO CAPS: ORAL_CAPSULE | ORAL | 0 refills | Status: DC

## 2023-02-11 ENCOUNTER — Other Ambulatory Visit: Payer: Self-pay | Admitting: Family Medicine

## 2023-02-11 DIAGNOSIS — D696 Thrombocytopenia, unspecified: Secondary | ICD-10-CM

## 2023-02-11 DIAGNOSIS — E7800 Pure hypercholesterolemia, unspecified: Secondary | ICD-10-CM

## 2023-02-11 DIAGNOSIS — Z0000 Encounter for general adult medical examination without abnormal findings: Secondary | ICD-10-CM

## 2023-02-17 ENCOUNTER — Other Ambulatory Visit: Payer: BC Managed Care – PPO

## 2023-02-17 DIAGNOSIS — Z0000 Encounter for general adult medical examination without abnormal findings: Secondary | ICD-10-CM

## 2023-02-17 DIAGNOSIS — D696 Thrombocytopenia, unspecified: Secondary | ICD-10-CM

## 2023-02-17 DIAGNOSIS — E7800 Pure hypercholesterolemia, unspecified: Secondary | ICD-10-CM | POA: Diagnosis not present

## 2023-02-17 DIAGNOSIS — R7301 Impaired fasting glucose: Secondary | ICD-10-CM | POA: Diagnosis not present

## 2023-02-18 LAB — COMPREHENSIVE METABOLIC PANEL
ALT: 14 [IU]/L (ref 0–32)
AST: 18 [IU]/L (ref 0–40)
Albumin: 4.7 g/dL (ref 3.8–4.9)
Alkaline Phosphatase: 110 [IU]/L (ref 44–121)
BUN/Creatinine Ratio: 18 (ref 9–23)
BUN: 14 mg/dL (ref 6–24)
Bilirubin Total: 0.5 mg/dL (ref 0.0–1.2)
CO2: 25 mmol/L (ref 20–29)
Calcium: 9.7 mg/dL (ref 8.7–10.2)
Chloride: 104 mmol/L (ref 96–106)
Creatinine, Ser: 0.77 mg/dL (ref 0.57–1.00)
Globulin, Total: 2.2 g/dL (ref 1.5–4.5)
Glucose: 94 mg/dL (ref 70–99)
Potassium: 4.4 mmol/L (ref 3.5–5.2)
Sodium: 144 mmol/L (ref 134–144)
Total Protein: 6.9 g/dL (ref 6.0–8.5)
eGFR: 92 mL/min/{1.73_m2} (ref >59)

## 2023-02-18 LAB — CBC WITH DIFFERENTIAL/PLATELET
Basophils Absolute: 0 10*3/uL (ref 0.0–0.2)
Basos: 1 %
EOS (ABSOLUTE): 0.1 10*3/uL (ref 0.0–0.4)
Eos: 2 %
Hematocrit: 38.3 % (ref 34.0–46.6)
Hemoglobin: 12.8 g/dL (ref 11.1–15.9)
Immature Grans (Abs): 0 10*3/uL (ref 0.0–0.1)
Immature Granulocytes: 0 %
Lymphocytes Absolute: 1.4 10*3/uL (ref 0.7–3.1)
Lymphs: 27 %
MCH: 32.5 pg (ref 26.6–33.0)
MCHC: 33.4 g/dL (ref 31.5–35.7)
MCV: 97 fL (ref 79–97)
Monocytes Absolute: 0.3 10*3/uL (ref 0.1–0.9)
Monocytes: 6 %
Neutrophils Absolute: 3.3 10*3/uL (ref 1.4–7.0)
Neutrophils: 64 %
Platelets: 166 10*3/uL (ref 150–450)
RBC: 3.94 x10E6/uL (ref 3.77–5.28)
RDW: 12 % (ref 11.7–15.4)
WBC: 5.1 10*3/uL (ref 3.4–10.8)

## 2023-02-18 LAB — LIPID PANEL
Chol/HDL Ratio: 2.7 ratio (ref 0.0–4.4)
Cholesterol, Total: 194 mg/dL (ref 100–199)
HDL: 71 mg/dL (ref >39)
LDL Chol Calc (NIH): 116 mg/dL — ABNORMAL HIGH (ref 0–99)
Triglycerides: 38 mg/dL (ref 0–149)
VLDL Cholesterol Cal: 7 mg/dL (ref 5–40)

## 2023-02-18 LAB — TSH: TSH: 4.18 u[IU]/mL (ref 0.450–4.500)

## 2023-02-18 LAB — HEMOGLOBIN A1C
Est. average glucose Bld gHb Est-mCnc: 120 mg/dL
Hgb A1c MFr Bld: 5.8 % — ABNORMAL HIGH (ref 4.8–5.6)

## 2023-02-24 ENCOUNTER — Encounter: Payer: Self-pay | Admitting: Family Medicine

## 2023-02-24 ENCOUNTER — Ambulatory Visit (INDEPENDENT_AMBULATORY_CARE_PROVIDER_SITE_OTHER): Payer: BC Managed Care – PPO | Admitting: Family Medicine

## 2023-02-24 VITALS — BP 98/68 | HR 70 | Resp 18 | Ht 67.32 in | Wt 172.0 lb

## 2023-02-24 DIAGNOSIS — Z0000 Encounter for general adult medical examination without abnormal findings: Secondary | ICD-10-CM

## 2023-02-24 DIAGNOSIS — Z1329 Encounter for screening for other suspected endocrine disorder: Secondary | ICD-10-CM | POA: Diagnosis not present

## 2023-02-24 DIAGNOSIS — G2581 Restless legs syndrome: Secondary | ICD-10-CM

## 2023-02-24 DIAGNOSIS — D696 Thrombocytopenia, unspecified: Secondary | ICD-10-CM

## 2023-02-24 MED ORDER — GABAPENTIN 100 MG PO CAPS: 100.0000 mg | ORAL_CAPSULE | Freq: Every day | ORAL | 3 refills | Status: AC

## 2023-02-24 NOTE — Assessment & Plan Note (Signed)
Stable, controlled. Continue gabapentin 100-300 mg daily at bedtime. May adjust dose as needed based on symptoms.

## 2023-02-24 NOTE — Progress Notes (Signed)
Complete physical exam  Patient: [name]   DOB: October 28, 1970   52 y.o. Female  MRN: [id number]  Subjective:    Chief Complaint  Patient presents with   Annual Exam    [name] is a 52 y.o. female who presents today for a complete physical exam. She reports consuming a general and balanced diet prioritizing grilled meats and vegetables.  She enjoys working in the yard/garden outside. There are times when she stands up from bending down and gets dizzy when she is working outside.  She generally feels well. She reports sleeping well, only needs gabapentin and no longer needs trazodone. She does not have additional problems to discuss today.    Most recent fall risk assessment:    02/18/2022    9:20 AM  Fall Risk   Falls in the past year? 0  Number falls in past yr: 0  Injury with Fall? 0     Most recent depression and anxiety screenings:    02/24/2023    8:15 AM 02/18/2022    9:20 AM  PHQ 2/9 Scores  PHQ - 2 Score 0 0  PHQ- 9 Score 1 0      02/18/2022    9:20 AM 02/10/2021    8:48 AM  GAD 7 : Generalized Anxiety Score  Nervous, Anxious, on Edge 0 0  Control/stop worrying 0 0  Worry too much - different things 0 0  Trouble relaxing 0 0  Restless 0 0  Easily annoyed or irritable 0 0  Afraid - awful might happen 0 0  Total GAD 7 Score 0 0  Anxiety Difficulty Not difficult at all     Patient Active Problem List   Diagnosis Date Noted   Family history of breast cancer    Family history of colon cancer    Family history of uterine cancer    Chronic bilateral thoracic back pain 05/30/2018   Restless leg syndrome 01/16/2018   TMJ (temporomandibular joint syndrome) 08/24/2017   PAC (premature atrial contraction) 07/12/2011    Past Surgical History:  Procedure Laterality Date   APPENDECTOMY     BREAST BIOPSY Left 03/01/2019   CESAREAN SECTION  1992, 2005   COLONOSCOPY  2021   LAPAROSCOPIC APPENDECTOMY  2012   UPPER GASTROINTESTINAL ENDOSCOPY      Social History   Tobacco Use   Smoking status: Never    Passive exposure: Never   Smokeless tobacco: Never  Vaping Use   Vaping status: Never Used  Substance Use Topics   Alcohol use: No   Drug use: No   Family History  Problem Relation Age of Onset   Cancer Sister        Breast and brain   Breast cancer Sister 17   Cancer Mother        colon   Hypertension Mother    Colon cancer Mother 61   Hyperlipidemia Father    Hypertension Father    Hypothyroidism Father    Healthy Son    Alcohol abuse Maternal Uncle    Cancer Maternal Grandmother        lung   Kidney disease Maternal Grandfather    Heart disease Paternal Grandmother    Pneumonia Paternal Grandfather    Hypothyroidism Sister    Uterine cancer Sister 13   Healthy Son    Colon cancer Paternal Uncle        d. 43   Colon polyps Neg Hx    Esophageal cancer Neg  Hx    Rectal cancer Neg Hx    Stomach cancer Neg Hx    Allergies  Allergen Reactions   Vancomycin Anaphylaxis     Patient Care Team: Melida Quitter, PA as PCP - General (Family Medicine) Ob/Gyn, [facility]   Outpatient Medications Prior to Visit  Medication Sig   Cholecalciferol (VITAMIN D3) 2000 units TABS Take 1 capsule by mouth daily.   ibuprofen (ADVIL,MOTRIN) 200 MG tablet Take 200 mg by mouth as needed.   Multiple Vitamins-Minerals (ADULT ONE DAILY GUMMIES PO) Take 1 each by mouth daily.   Omega-3 Fatty Acids (FISH OIL PO) Take by mouth.   Probiotic Product (ALIGN) 4 MG CAPS Take 1 capsule (4 mg total) by mouth 2 (two) times daily.   vitamin B-12 (CYANOCOBALAMIN) 1000 MCG tablet Take 1,000 mcg by mouth daily.   [DISCONTINUED] gabapentin (NEURONTIN) 100 MG capsule TAKE 1 OR 2 CAPSULES BY MOUTH EVERY NIGHT AT BEDTIME AS NEEDED FOR RESTLESS LEG SYNDROME   [DISCONTINUED] levonorgestrel (MIRENA) 20 MCG/24HR IUD 1 each by Intrauterine route once.   [DISCONTINUED] benzonatate (TESSALON) 100 MG capsule Take 1 capsule (100 mg total) by mouth  every 8 (eight) hours as needed for cough.   [DISCONTINUED] cyclobenzaprine (FLEXERIL) 10 MG tablet Take 1 tablet (10 mg total) by mouth 2 (two) times daily as needed for muscle spasms.   [DISCONTINUED] promethazine-dextromethorphan (PROMETHAZINE-DM) 6.25-15 MG/5ML syrup Take 5 mLs by mouth at bedtime as needed for cough.   [DISCONTINUED] traZODone (DESYREL) 50 MG tablet TAKE 1/2 TO 1 TABLET(25 TO 50 MG) BY MOUTH AT BEDTIME AS NEEDED FOR SLEEP (Patient not taking: Reported on 01/13/2022)   No facility-administered medications prior to visit.    Review of Systems  Constitutional:  Negative for chills, fever and malaise/fatigue.  HENT:  Negative for congestion and hearing loss.   Eyes:  Negative for blurred vision and double vision.  Respiratory:  Negative for cough and shortness of breath.   Cardiovascular:  Negative for chest pain, palpitations and leg swelling.  Gastrointestinal:  Negative for abdominal pain, constipation, diarrhea and heartburn.  Genitourinary:  Negative for frequency and urgency.  Musculoskeletal:  Negative for myalgias and neck pain.  Neurological:  Negative for headaches.  Endo/Heme/Allergies:  Negative for polydipsia.  Psychiatric/Behavioral:  Negative for depression. The patient is not nervous/anxious and does not have insomnia.       Objective:    BP 98/68 (BP Location: Left Arm, Patient Position: Sitting, Cuff Size: Normal)   Pulse 70   Resp 18   Ht 5' 7.32" (1.71 m)   Wt 172 lb (78 kg)   LMP  (LMP Unknown)   SpO2 100%   BMI 26.68 kg/m    Physical Exam Constitutional:      General: She is not in acute distress.    Appearance: Normal appearance.  HENT:     Head: Normocephalic and atraumatic.     Right Ear: Tympanic membrane, ear canal and external ear normal.     Left Ear: Tympanic membrane, ear canal and external ear normal.     Nose: Nose normal.     Mouth/Throat:     Mouth: Mucous membranes are moist.     Pharynx: No oropharyngeal exudate or  posterior oropharyngeal erythema.  Eyes:     Extraocular Movements: Extraocular movements intact.     Conjunctiva/sclera: Conjunctivae normal.     Pupils: Pupils are equal, round, and reactive to light.     Comments: Wearing glasses  Neck:  Thyroid: No thyroid mass, thyromegaly or thyroid tenderness.  Cardiovascular:     Rate and Rhythm: Normal rate and regular rhythm.     Heart sounds: Normal heart sounds. No murmur heard.    No friction rub. No gallop.  Pulmonary:     Effort: Pulmonary effort is normal. No respiratory distress.     Breath sounds: Normal breath sounds. No wheezing, rhonchi or rales.  Abdominal:     General: Abdomen is flat. Bowel sounds are normal. There is no distension.     Palpations: There is no mass.     Tenderness: There is no abdominal tenderness. There is no guarding.  Musculoskeletal:        General: Normal range of motion.     Cervical back: Normal range of motion and neck supple.  Lymphadenopathy:     Cervical: No cervical adenopathy.  Skin:    General: Skin is warm and dry.  Neurological:     Mental Status: She is alert and oriented to person, place, and time.     Cranial Nerves: No cranial nerve deficit.     Motor: No weakness.     Deep Tendon Reflexes: Reflexes normal.  Psychiatric:        Mood and Affect: Mood normal.       Assessment & Plan:    Routine Health Maintenance and Physical Exam  Immunization History  Administered Date(s) Administered   PFIZER(Purple Top)SARS-COV-2 Vaccination 07/21/2020, 08/11/2020   Tdap 02/07/2019    Health Maintenance  Topic Date Due   Zoster Vaccines- Shingrix (1 of 2) Never done   COVID-19 Vaccine (3 - 2023-24 season) 03/12/2023 (Originally 12/19/2022)   INFLUENZA VACCINE  07/18/2023 (Originally 11/18/2022)   MAMMOGRAM  04/23/2024   Colonoscopy  04/15/2025   Cervical Cancer Screening (HPV/Pap Cotest)  10/29/2027   DTaP/Tdap/Td (2 - Td or Tdap) 02/06/2029   Hepatitis C Screening  Completed   HIV  Screening  Completed   HPV VACCINES  Aged Out    Reviewed most recent labs including CBC, CMP, lipid panel, A1C, TSH, and vitamin D. All within normal limits/stable from last check other than A1c 5.8, LDL 116. Cervical cancer screening updated at [facility] 10/29/2022, NILM and negative HPV. Discussed Shingles recommendations.  Discussed health benefits of physical activity, and encouraged her to engage in regular exercise appropriate for her age and condition.  Wellness examination  Thrombocytopenia (HCC) -     CBC with Differential/Platelet; Future  Screening for thyroid disorder -     TSH; Future -     T4, free; Future  Restless leg syndrome Assessment & Plan: Stable, controlled. Continue gabapentin 100-300 mg daily at bedtime. May adjust dose as needed based on symptoms.  Orders: -     Gabapentin; Take 1-3 capsules (100-300 mg total) by mouth at bedtime. FOR RESTLESS LEG SYNDROME  Dispense: 270 capsule; Refill: 3  Recheck thyroid labs in 6 months given family history of thyroid disorder. Recheck CBC with history of thrombocytopenia, platelets within normal limits on CBC on 02/17/2023.  Return in about 6 months (around 08/24/2023) for nonfasting labs; 1 year for annual physical, fasting labs 1 week before.     Melida Quitter, PA

## 2023-03-02 ENCOUNTER — Encounter: Payer: Self-pay | Admitting: Family Medicine

## 2023-05-26 ENCOUNTER — Encounter: Payer: Self-pay | Admitting: Family Medicine

## 2023-08-24 ENCOUNTER — Other Ambulatory Visit: Payer: BC Managed Care – PPO

## 2023-09-30 ENCOUNTER — Other Ambulatory Visit: Payer: Self-pay

## 2023-09-30 DIAGNOSIS — Z1231 Encounter for screening mammogram for malignant neoplasm of breast: Secondary | ICD-10-CM

## 2023-10-04 ENCOUNTER — Ambulatory Visit
Admission: RE | Admit: 2023-10-04 | Discharge: 2023-10-04 | Discharge status: Home / Self Care | Source: Ambulatory Visit

## 2023-10-04 DIAGNOSIS — Z1231 Encounter for screening mammogram for malignant neoplasm of breast: Secondary | ICD-10-CM | POA: Diagnosis not present

## 2023-10-06 ENCOUNTER — Ambulatory Visit: Payer: Self-pay

## 2023-11-07 DIAGNOSIS — M533 Sacrococcygeal disorders, not elsewhere classified: Secondary | ICD-10-CM | POA: Diagnosis not present

## 2023-11-09 DIAGNOSIS — Z01419 Encounter for gynecological examination (general) (routine) without abnormal findings: Secondary | ICD-10-CM | POA: Diagnosis not present

## 2024-02-17 ENCOUNTER — Other Ambulatory Visit: Payer: BC Managed Care – PPO

## 2024-02-24 ENCOUNTER — Encounter: Payer: BC Managed Care – PPO | Admitting: Family Medicine

## 2024-02-28 ENCOUNTER — Ambulatory Visit (INDEPENDENT_AMBULATORY_CARE_PROVIDER_SITE_OTHER): Admitting: General Practice

## 2024-02-28 VITALS — BP 122/84 | HR 76 | Temp 97.9°F | Ht 67.8 in | Wt 180.0 lb

## 2024-02-28 DIAGNOSIS — Z800 Family history of malignant neoplasm of digestive organs: Secondary | ICD-10-CM | POA: Diagnosis not present

## 2024-02-28 DIAGNOSIS — Z1231 Encounter for screening mammogram for malignant neoplasm of breast: Secondary | ICD-10-CM

## 2024-02-28 DIAGNOSIS — R7303 Prediabetes: Secondary | ICD-10-CM

## 2024-02-28 DIAGNOSIS — E7800 Pure hypercholesterolemia, unspecified: Secondary | ICD-10-CM | POA: Insufficient documentation

## 2024-02-28 DIAGNOSIS — Z7689 Persons encountering health services in other specified circumstances: Secondary | ICD-10-CM | POA: Diagnosis not present

## 2024-02-28 DIAGNOSIS — Z8049 Family history of malignant neoplasm of other genital organs: Secondary | ICD-10-CM

## 2024-02-28 DIAGNOSIS — Z803 Family history of malignant neoplasm of breast: Secondary | ICD-10-CM

## 2024-02-28 NOTE — Patient Instructions (Signed)
 Schedule physical and fasting labs.   It was a pleasure to meet you today! Please don't hesitate to contact me with any questions. Welcome to [facility]!

## 2024-02-28 NOTE — Progress Notes (Signed)
 New Patient Office Visit  Subjective    Patient ID: [name], female    DOB: 1970-10-03  Age: 53 y.o. MRN: [id number]  CC:  Chief Complaint  Patient presents with   New Patient (Initial Visit)    TOC from Joesph Sear, PA   restless leg    Currently takes Gabapentin  100 mg (1-3 caps PRN); no refill needed yet but will need to be refilled when needed.     HPI [name] is a 53 y.o. female presents to establish care.  Previous PCP/physical/labs: Joesph Sear PA. November, 2024- physical and labs.   Discussed the use of AI scribe software for clinical note transcription with the patient, who gave verbal consent to proceed.  History of Present Illness [name] is a 53 year old female who presents for transfer of care and medication management.  She is currently taking gabapentin  100-300 mg PRN for restless leg syndrome. She usually use takes one when needed.  Her last physical examination was in November of the previous year, and blood work conducted in October indicated slight prediabetes, normal thyroid  function, good overall blood counts, and normal kidney and liver function. There was a mild elevation in cholesterol levels, specifically LDL cholesterol.  She has a family history of cancer, with her mother diagnosed with colon cancer in her early sixties, and her sister having had cancer that started in the sternum and spread to the breast and brain at age 37. Another sister had uterine cancer several years ago. She is up to date on her colonoscopy, with the next one scheduled for December 2026.  She has a history of vitamin D  deficiency and is currently taking a daily supplement. She also takes a vitamin B supplement, though she is unsure if she has a B12 deficiency. She has a family history of thyroid  problems, though she is unsure if they are hypo or hyperthyroid conditions.    Outpatient Encounter Medications as of 02/28/2024  Medication Sig    Cholecalciferol (VITAMIN D3) 2000 units TABS Take 1 capsule by mouth daily.   gabapentin  (NEURONTIN ) 100 MG capsule Take 1-3 capsules (100-300 mg total) by mouth at bedtime. FOR RESTLESS LEG SYNDROME   ibuprofen (ADVIL,MOTRIN) 200 MG tablet Take 200 mg by mouth as needed.   Multiple Vitamins-Minerals (ADULT ONE DAILY GUMMIES PO) Take 1 each by mouth daily.   Omega-3 Fatty Acids (FISH OIL PO) Take by mouth.   Probiotic Product (ALIGN) 4 MG CAPS Take 1 capsule (4 mg total) by mouth 2 (two) times daily.   vitamin B-12 (CYANOCOBALAMIN) 1000 MCG tablet Take 1,000 mcg by mouth daily.   No facility-administered encounter medications on file as of 02/28/2024.    Past Medical History:  Diagnosis Date   Acute appendicitis 2012   Family history of breast cancer    Family history of breast cancer    Family history of colon cancer    Family history of uterine cancer    Thrombocytopenia 02/29/2020    Past Surgical History:  Procedure Laterality Date   APPENDECTOMY     BREAST BIOPSY Left 03/01/2019   CESAREAN SECTION  1992, 2005   COLONOSCOPY  2021   LAPAROSCOPIC APPENDECTOMY  2012   UPPER GASTROINTESTINAL ENDOSCOPY      Family History  Problem Relation Age of Onset   Cancer Sister        Breast and brain   Breast cancer Sister 66   Cancer Mother  colon   Hypertension Mother    Colon cancer Mother 48   Hyperlipidemia Father    Hypertension Father    Hypothyroidism Father    Healthy Son    Alcohol abuse Maternal Uncle    Cancer Maternal Grandmother        lung   Kidney disease Maternal Grandfather    Heart disease Paternal Grandmother    Pneumonia Paternal Grandfather    Hypothyroidism Sister    Uterine cancer Sister 58   Healthy Son    Colon cancer Paternal Uncle        d. 53   Colon polyps Neg Hx    Esophageal cancer Neg Hx    Rectal cancer Neg Hx    Stomach cancer Neg Hx     Social History   Socioeconomic History   Marital status: Married    Spouse name: Not  on file   Number of children: 2   Years of education: Not on file   Highest education level: 12th grade  Occupational History   Occupation: Buyer, Retail: [employer]  Tobacco Use   Smoking status: Never    Passive exposure: Never   Smokeless tobacco: Never  Vaping Use   Vaping status: Never Used  Substance and Sexual Activity   Alcohol use: No   Drug use: No   Sexual activity: Yes    Birth control/protection: I.U.D.  Other Topics Concern   Not on file  Social History Narrative   Not on file   Social Drivers of Health   Financial Resource Strain: Low Risk  (02/28/2024)   Overall Financial Resource Strain (CARDIA)    Difficulty of Paying Living Expenses: Not hard at all  Food Insecurity: No Food Insecurity (02/28/2024)   Hunger Vital Sign    Worried About Running Out of Food in the Last Year: Never true    Ran Out of Food in the Last Year: Never true  Transportation Needs: No Transportation Needs (02/28/2024)   PRAPARE - Administrator, Civil Service (Medical): No    Lack of Transportation (Non-Medical): No  Physical Activity: Insufficiently Active (02/28/2024)   Exercise Vital Sign    Days of Exercise per Week: 3 days    Minutes of Exercise per Session: 20 min  Stress: No Stress Concern Present (02/28/2024)   [employer] of Occupational Health - Occupational Stress Questionnaire    Feeling of Stress: Not at all  Social Connections: Socially Integrated (02/28/2024)   Social Connection and Isolation Panel    Frequency of Communication with Friends and Family: More than three times a week    Frequency of Social Gatherings with Friends and Family: More than three times a week    Attends Religious Services: More than 4 times per year    Active Member of [employer] or Organizations: Yes    Attends Banker Meetings: Patient declined    Marital Status: Married  Catering Manager Violence: Not on file    Review of Systems   Constitutional:  Negative for chills and fever.  Respiratory:  Negative for shortness of breath.   Cardiovascular:  Negative for chest pain.  Gastrointestinal:  Negative for abdominal pain, constipation, diarrhea, heartburn, nausea and vomiting.  Genitourinary:  Negative for dysuria, frequency and urgency.  Neurological:  Negative for dizziness and headaches.  Endo/Heme/Allergies:  Negative for polydipsia.  Psychiatric/Behavioral:  Negative for depression and suicidal ideas. The patient is not nervous/anxious.  Objective    BP 122/84   Pulse 76   Temp 97.9 F (36.6 C) (Temporal)   Ht 5' 7.8 (1.722 m)   Wt 180 lb (81.6 kg)   SpO2 99%   BMI 27.53 kg/m   Physical Exam Vitals and nursing note reviewed.  Constitutional:      Appearance: Normal appearance.  Cardiovascular:     Rate and Rhythm: Normal rate and regular rhythm.     Pulses: Normal pulses.     Heart sounds: Normal heart sounds.  Pulmonary:     Effort: Pulmonary effort is normal.     Breath sounds: Normal breath sounds.  Neurological:     Mental Status: She is alert and oriented to person, place, and time.  Psychiatric:        Mood and Affect: Mood normal.        Behavior: Behavior normal.        Thought Content: Thought content normal.        Judgment: Judgment normal.         Assessment & Plan:  Elevated LDL cholesterol level  Establishing care with new doctor, encounter for  Prediabetes  Family history of colon cancer  Encounter for screening mammogram for malignant neoplasm of breast -     MM 3D DIAGNOSTIC MAMMOGRAM BILATERAL BREAST; Future  Family history of breast cancer  Family history of uterine cancer    Assessment and Plan Assessment & Plan Restless legs syndrome Managed with gabapentin  as needed, typically one tablet, which is effective. - Continue gabapentin  as needed. - Refill gabapentin  prescription as needed.  Pure hypercholesterolemia Mild hypercholesterolemia  with elevated LDL levels. - fasting labs at next visit.  Vitamin D  deficiency Managed with daily supplementation. - Check vitamin D  levels during upcoming physical.  Vitamin B12 deficiency Managed with supplementation. - Check vitamin B12 levels during upcoming physical.  General Health Maintenance Routine health maintenance discussed, including vaccinations and screenings. Declined flu, pneumonia, and shingles vaccines. Mammogram and Pap smear are up to date. Colonoscopy is scheduled for December 2026. Discussed family history of cancer and the importance of monitoring for symptoms. - Ordered mammogram for future scheduling. - Scheduled physical and fasting labs in 1-2 weeks.   Return in about 1 week (around 03/06/2024) for physical and fasting labs. SABRA Carrol Aurora, NP

## 2024-03-06 ENCOUNTER — Ambulatory Visit: Admitting: General Practice

## 2024-03-06 ENCOUNTER — Encounter: Payer: Self-pay | Admitting: General Practice

## 2024-03-06 VITALS — BP 118/82 | HR 66 | Temp 97.9°F | Ht 67.8 in | Wt 181.0 lb

## 2024-03-06 DIAGNOSIS — Z1329 Encounter for screening for other suspected endocrine disorder: Secondary | ICD-10-CM | POA: Diagnosis not present

## 2024-03-06 DIAGNOSIS — Z0000 Encounter for general adult medical examination without abnormal findings: Secondary | ICD-10-CM | POA: Insufficient documentation

## 2024-03-06 DIAGNOSIS — E538 Deficiency of other specified B group vitamins: Secondary | ICD-10-CM | POA: Diagnosis not present

## 2024-03-06 DIAGNOSIS — E7800 Pure hypercholesterolemia, unspecified: Secondary | ICD-10-CM | POA: Diagnosis not present

## 2024-03-06 DIAGNOSIS — E559 Vitamin D deficiency, unspecified: Secondary | ICD-10-CM | POA: Diagnosis not present

## 2024-03-06 NOTE — Patient Instructions (Addendum)
 Stop by the lab prior to leaving today. I will notify you of your results once received.   Schedule for physical for next year.   It was a pleasure to see you today!

## 2024-03-06 NOTE — Progress Notes (Signed)
 Established Patient Office Visit  Subjective   Patient ID: SUSANN LAWHORNE, female    DOB: [birth year]  Age: 53 y.o. MRN: [id number]  Chief Complaint  Patient presents with   Annual Exam    HPI  TASHICA PROVENCIO is a 53 year old female   for complete physical and follow up of chronic conditions.  Immunizations: -Tetanus: Completed in 2020 -Influenza: due; declines -Shingles: due; declines -Pneumonia: due; declines  Diet: Fair diet.  Exercise: No regular exercise.  Eye exam: Completes annually  Dental exam: Completes semi-annually    Pap Smear: Completed in 2024 Mammogram: Completed in 2025  Colonoscopy: Completed in  2021   Patient Active Problem List   Diagnosis Date Noted   Encounter for screening and preventative care 03/06/2024   Screening for thyroid  disorder 03/06/2024   Vitamin D  deficiency 03/06/2024   Vitamin B12 deficiency 03/06/2024   Establishing care with new doctor, encounter for 02/28/2024   Elevated LDL cholesterol level 02/28/2024   Family history of breast cancer    Family history of colon cancer    Family history of uterine cancer    Chronic bilateral thoracic back pain 05/30/2018   Restless leg syndrome 01/16/2018   TMJ (temporomandibular joint syndrome) 08/24/2017   PAC (premature atrial contraction) 07/12/2011   Past Medical History:  Diagnosis Date   Acute appendicitis 2012   Family history of breast cancer    Family history of breast cancer    Family history of colon cancer    Family history of uterine cancer    Thrombocytopenia 02/29/2020   Past Surgical History:  Procedure Laterality Date   APPENDECTOMY     BREAST BIOPSY Left 03/01/2019   CESAREAN SECTION  1992, 2005   COLONOSCOPY  2021   LAPAROSCOPIC APPENDECTOMY  2012   UPPER GASTROINTESTINAL ENDOSCOPY     Allergies  Allergen Reactions   Vancomycin  Anaphylaxis         03/06/2024    3:40 PM 02/28/2024    8:57 AM 02/24/2023    8:15 AM  Depression screen PHQ 2/9   Decreased Interest 0 0 0  Down, Depressed, Hopeless 0 0 0  PHQ - 2 Score 0 0 0  Altered sleeping 0 0 0  Tired, decreased energy 0 0 1  Change in appetite 0 0 0  Feeling bad or failure about yourself  0 0 0  Trouble concentrating 0 0 0  Moving slowly or fidgety/restless 0 0 0  Suicidal thoughts 0 0 0  PHQ-9 Score 0 0 1   Difficult doing work/chores Not difficult at all Not difficult at all Not difficult at all     Data saved with a previous flowsheet row definition       03/06/2024    3:40 PM 02/28/2024    8:57 AM 02/18/2022    9:20 AM 02/10/2021    8:48 AM  GAD 7 : Generalized Anxiety Score  Nervous, Anxious, on Edge 0 0 0 0  Control/stop worrying 0 0 0 0  Worry too much - different things 0 0 0 0  Trouble relaxing 0 0 0 0  Restless 0 0 0 0  Easily annoyed or irritable 0 0 0 0  Afraid - awful might happen 0 0 0 0  Total GAD 7 Score 0 0 0 0  Anxiety Difficulty Not difficult at all Not difficult at all Not difficult at all       Review of Systems  Constitutional:  Negative for chills,  fever, malaise/fatigue and weight loss.  HENT:  Negative for congestion, ear discharge, ear pain, hearing loss, nosebleeds, sinus pain, sore throat and tinnitus.   Eyes:  Negative for blurred vision, double vision, pain, discharge and redness.  Respiratory:  Negative for cough, shortness of breath, wheezing and stridor.   Cardiovascular:  Negative for chest pain, palpitations and leg swelling.  Gastrointestinal:  Negative for abdominal pain, constipation, diarrhea, heartburn, nausea and vomiting.  Genitourinary:  Negative for dysuria, frequency and urgency.  Musculoskeletal:  Negative for myalgias.  Skin:  Negative for rash.  Neurological:  Negative for dizziness, tingling, seizures, weakness and headaches.  Psychiatric/Behavioral:  Negative for depression, substance abuse and suicidal ideas. The patient is not nervous/anxious.       Objective:     BP 118/82   Pulse 66   Temp 97.9 F  (36.6 C) (Temporal)   Ht 5' 7.8 (1.722 m)   Wt 181 lb (82.1 kg)   SpO2 99%   BMI 27.68 kg/m  BP Readings from Last 3 Encounters:  03/06/24 118/82  02/28/24 122/84  02/24/23 98/68   Wt Readings from Last 3 Encounters:  03/06/24 181 lb (82.1 kg)  02/28/24 180 lb (81.6 kg)  02/24/23 172 lb (78 kg)      Physical Exam Vitals and nursing note reviewed.  Constitutional:      Appearance: Normal appearance.  HENT:     Head: Normocephalic and atraumatic.     Right Ear: Tympanic membrane, ear canal and external ear normal.     Left Ear: Tympanic membrane, ear canal and external ear normal.     Nose: Nose normal.     Mouth/Throat:     Mouth: Mucous membranes are moist.     Pharynx: Oropharynx is clear.  Eyes:     Conjunctiva/sclera: Conjunctivae normal.     Pupils: Pupils are equal, round, and reactive to light.  Cardiovascular:     Rate and Rhythm: Normal rate and regular rhythm.     Pulses: Normal pulses.     Heart sounds: Normal heart sounds.  Pulmonary:     Effort: Pulmonary effort is normal.     Breath sounds: Normal breath sounds.  Abdominal:     General: Abdomen is flat. Bowel sounds are normal.     Palpations: Abdomen is soft.  Musculoskeletal:        General: Normal range of motion.     Cervical back: Normal range of motion.  Skin:    General: Skin is warm and dry.     Capillary Refill: Capillary refill takes less than 2 seconds.  Neurological:     General: No focal deficit present.     Mental Status: She is alert and oriented to person, place, and time. Mental status is at baseline.  Psychiatric:        Mood and Affect: Mood normal.        Behavior: Behavior normal.        Thought Content: Thought content normal.        Judgment: Judgment normal.      No results found for any visits on 03/06/24.     The 10-year ASCVD risk score (Arnett DK, et al., 2019) is: 1.2%    Assessment & Plan:  Encounter for screening and preventative care Assessment &  Plan: Immunizations tdap UTD; declines others. Pap smear UTD. Mammogram UTD Colonoscopy UTD, due   Discussed the importance of a healthy diet and regular exercise in order for weight loss, and  to reduce the risk of further co-morbidity.  Exam stable. Labs pending.  Follow up in 1 year for repeat physical.   Orders: -     CBC -     Comprehensive metabolic panel with GFR -     Lipid panel  Screening for thyroid  disorder -     TSH -     T4, free -     T3, free  Vitamin D  deficiency -     VITAMIN D  25 Hydroxy (Vit-D Deficiency, Fractures)  Vitamin B12 deficiency -     Vitamin B12  Elevated LDL cholesterol level -     Hemoglobin A1c     Return in about 1 year (around 03/06/2025) for physical and fasting labs.SABRA Carrol Aurora, NP

## 2024-03-06 NOTE — Assessment & Plan Note (Signed)
 Immunizations tdap UTD; declines others. Pap smear UTD. Mammogram UTD Colonoscopy UTD, due   Discussed the importance of a healthy diet and regular exercise in order for weight loss, and to reduce the risk of further co-morbidity.  Exam stable. Labs pending.  Follow up in 1 year for repeat physical.

## 2024-03-07 ENCOUNTER — Ambulatory Visit: Payer: Self-pay | Admitting: General Practice

## 2024-03-07 LAB — VITAMIN D 25 HYDROXY (VIT D DEFICIENCY, FRACTURES): VITD: 26.09 ng/mL — ABNORMAL LOW (ref 30.00–100.00)

## 2024-03-07 LAB — LIPID PANEL
Cholesterol: 210 mg/dL — ABNORMAL HIGH (ref 0–200)
HDL: 62.7 mg/dL (ref >39.00)
LDL Cholesterol: 137 mg/dL — ABNORMAL HIGH (ref 0–99)
NonHDL: 146.83
Total CHOL/HDL Ratio: 3
Triglycerides: 47 mg/dL (ref 0.0–149.0)
VLDL: 9.4 mg/dL (ref 0.0–40.0)

## 2024-03-07 LAB — COMPREHENSIVE METABOLIC PANEL WITH GFR
ALT: 17 U/L (ref 0–35)
AST: 19 U/L (ref 0–37)
Albumin: 4.7 g/dL (ref 3.5–5.2)
Alkaline Phosphatase: 92 U/L (ref 39–117)
BUN: 16 mg/dL (ref 6–23)
CO2: 30 meq/L (ref 19–32)
Calcium: 9.6 mg/dL (ref 8.4–10.5)
Chloride: 101 meq/L (ref 96–112)
Creatinine, Ser: 0.74 mg/dL (ref 0.40–1.20)
GFR: 91.51 mL/min (ref >60.00)
Glucose, Bld: 87 mg/dL (ref 70–99)
Potassium: 4.2 meq/L (ref 3.5–5.1)
Sodium: 141 meq/L (ref 135–145)
Total Bilirubin: 0.6 mg/dL (ref 0.2–1.2)
Total Protein: 7.4 g/dL (ref 6.0–8.3)

## 2024-03-07 LAB — CBC
HCT: 38.9 % (ref 36.0–46.0)
Hemoglobin: 13.3 g/dL (ref 12.0–15.0)
MCHC: 34.2 g/dL (ref 30.0–36.0)
MCV: 94.2 fl (ref 78.0–100.0)
Platelets: 157 K/uL (ref 150.0–400.0)
RBC: 4.13 Mil/uL (ref 3.87–5.11)
RDW: 13.5 % (ref 11.5–15.5)
WBC: 5.3 K/uL (ref 4.0–10.5)

## 2024-03-07 LAB — TSH: TSH: 2.94 u[IU]/mL (ref 0.35–5.50)

## 2024-03-07 LAB — VITAMIN B12: Vitamin B-12: 419 pg/mL (ref 211–911)

## 2024-03-07 LAB — T3, FREE: T3, Free: 3.1 pg/mL (ref 2.3–4.2)

## 2024-03-07 LAB — HEMOGLOBIN A1C: Hgb A1c MFr Bld: 5.6 % (ref 4.6–6.5)

## 2024-03-07 LAB — T4, FREE: Free T4: 0.75 ng/dL (ref 0.60–1.60)

## 2024-03-08 ENCOUNTER — Encounter: Payer: Self-pay | Admitting: General Practice

## 2024-03-08 MED ORDER — VITAMIN D (ERGOCALCIFEROL) 1.25 MG (50000 UNIT) PO CAPS: 50000.0000 [IU] | ORAL_CAPSULE | ORAL | 2 refills | Status: AC

## 2024-03-08 NOTE — Telephone Encounter (Signed)
 Pended 12 week script with requested pharmacy ok to send as pended?

## 2024-04-02 ENCOUNTER — Ambulatory Visit: Admitting: General Practice

## 2024-04-02 ENCOUNTER — Encounter: Payer: Self-pay | Admitting: General Practice

## 2024-04-02 VITALS — BP 117/78 | HR 94 | Temp 97.9°F | Ht 67.8 in | Wt 182.0 lb

## 2024-04-02 DIAGNOSIS — E559 Vitamin D deficiency, unspecified: Secondary | ICD-10-CM | POA: Diagnosis not present

## 2024-04-02 DIAGNOSIS — K649 Unspecified hemorrhoids: Secondary | ICD-10-CM | POA: Diagnosis not present

## 2024-04-02 DIAGNOSIS — E7800 Pure hypercholesterolemia, unspecified: Secondary | ICD-10-CM

## 2024-04-02 MED ORDER — HYDROCORT-PRAMOXINE (PERIANAL) 2.5-1 % EX CREA: 1.0000 | TOPICAL_CREAM | Freq: Three times a day (TID) | CUTANEOUS | 0 refills | Status: AC

## 2024-04-02 NOTE — Progress Notes (Signed)
 Established Patient Office Visit  Subjective   Patient ID: [name], female    DOB: 1970/08/17  Age: 53 y.o. MRN: [id number]  Chief Complaint  Patient presents with   Hemorrhoids    Since child birth. Has tried the otc creams and warm baths but nothing has seemed to help in the past. Patient has never tried an rx for this and states she had made them bleed in the past. Patient stated that they can be itchy at times and she sometimes overdoes it with the toilet paper.     HPI  Discussed the use of AI scribe software for clinical note transcription with the patient, who gave verbal consent to proceed.  History of Present Illness [name] is a 53 year old female who presents with persistent external hemorrhoids and severe itching.  She has experienced hemorrhoids for a long time, with a recent increase in size. The hemorrhoids are external, causing severe itching but no pain. There has been no significant bleeding, except for a single instance of minor bleeding due to aggressive wiping.  She has tried various over-the-counter treatments, including fiber supplements, sitz baths, and Preparation H spray, with limited relief. Suppositories provide temporary relief, and she uses a donut cushion to alleviate symptoms while sitting.  Her current symptoms include severe itching without pain. No blood in stool, diarrhea, constipation, nausea, vomiting, fever, or chills. A colonoscopy is scheduled for December 2026.  She works from home, which involves prolonged sitting. She tries to incorporate more water and fiber into her diet.    Patient Active Problem List   Diagnosis Date Noted   Hemorrhoids 04/02/2024   Encounter for screening and preventative care 03/06/2024   Screening for thyroid  disorder 03/06/2024   Vitamin D  deficiency 03/06/2024   Vitamin B12 deficiency 03/06/2024   Establishing care with new doctor, encounter for 02/28/2024   Elevated LDL cholesterol level  02/28/2024   Family history of breast cancer    Family history of colon cancer    Family history of uterine cancer    Chronic bilateral thoracic back pain 05/30/2018   Restless leg syndrome 01/16/2018   TMJ (temporomandibular joint syndrome) 08/24/2017   PAC (premature atrial contraction) 07/12/2011   Past Medical History:  Diagnosis Date   Acute appendicitis 2012   Family history of breast cancer    Family history of breast cancer    Family history of colon cancer    Family history of uterine cancer    Thrombocytopenia 02/29/2020   Past Surgical History:  Procedure Laterality Date   APPENDECTOMY     BREAST BIOPSY Left 03/01/2019   CESAREAN SECTION  1992, 2005   COLONOSCOPY  2021   LAPAROSCOPIC APPENDECTOMY  2012   UPPER GASTROINTESTINAL ENDOSCOPY     Allergies[1]       04/02/2024   11:48 AM 03/06/2024    3:40 PM 02/28/2024    8:57 AM  Depression screen PHQ 2/9  Decreased Interest 0 0 0  Down, Depressed, Hopeless 0 0 0  PHQ - 2 Score 0 0 0  Altered sleeping 0 0 0  Tired, decreased energy 0 0 0  Change in appetite 0 0 0  Feeling bad or failure about yourself  0 0 0  Trouble concentrating 0 0 0  Moving slowly or fidgety/restless 0 0 0  Suicidal thoughts 0 0 0  PHQ-9 Score 0 0 0  Difficult doing work/chores Not difficult at all Not difficult at all Not difficult at  all       04/02/2024   11:48 AM 03/06/2024    3:40 PM 02/28/2024    8:57 AM 02/18/2022    9:20 AM  GAD 7 : Generalized Anxiety Score  Nervous, Anxious, on Edge 0 0 0 0  Control/stop worrying 0 0 0 0  Worry too much - different things 0 0 0 0  Trouble relaxing 0 0 0 0  Restless 0 0 0 0  Easily annoyed or irritable 0 0 0 0  Afraid - awful might happen 0 0 0 0  Total GAD 7 Score 0 0 0 0  Anxiety Difficulty Not difficult at all Not difficult at all Not difficult at all Not difficult at all      Review of Systems  Constitutional:  Negative for chills and fever.  Respiratory:  Negative for  shortness of breath.   Cardiovascular:  Negative for chest pain.  Gastrointestinal:  Negative for abdominal pain, constipation, diarrhea, heartburn, nausea and vomiting.       Rectal itching  Genitourinary:  Negative for dysuria, frequency and urgency.  Neurological:  Negative for dizziness and headaches.  Endo/Heme/Allergies:  Negative for polydipsia.  Psychiatric/Behavioral:  Negative for depression and suicidal ideas. The patient is not nervous/anxious.       Objective:     BP 117/78   Pulse 94   Temp 97.9 F (36.6 C) (Oral)   Ht 5' 7.8 (1.722 m)   Wt 182 lb (82.6 kg)   SpO2 99%   BMI 27.84 kg/m  BP Readings from Last 3 Encounters:  04/02/24 117/78  03/06/24 118/82  02/28/24 122/84   Wt Readings from Last 3 Encounters:  04/02/24 182 lb (82.6 kg)  03/06/24 181 lb (82.1 kg)  02/28/24 180 lb (81.6 kg)      Physical Exam Vitals and nursing note reviewed.  Constitutional:      Appearance: Normal appearance.  Cardiovascular:     Rate and Rhythm: Normal rate and regular rhythm.     Pulses: Normal pulses.     Heart sounds: Normal heart sounds.  Pulmonary:     Effort: Pulmonary effort is normal.     Breath sounds: Normal breath sounds.  Genitourinary:    Comments: Deferred exam today Neurological:     Mental Status: She is alert and oriented to person, place, and time.  Psychiatric:        Mood and Affect: Mood normal.        Behavior: Behavior normal.        Thought Content: Thought content normal.        Judgment: Judgment normal.      No results found for any visits on 04/02/24.     The 10-year ASCVD risk score (Arnett DK, et al., 2019) is: 1.4%    Assessment & Plan:  Hemorrhoids, unspecified hemorrhoid type -     Hydrocort -Pramoxine (Perianal); Place 1 Application rectally 3 (three) times daily.  Dispense: 30 g; Refill: 0    Assessment and Plan Assessment & Plan Hemorrhoids Chronic external hemorrhoids with increased size and severe itching.  Previous treatments provided limited relief. - deferred rectal exam today. - Prescribed Analpram-HC cream for inflammation and itching. - Advised increased water intake and dietary fiber to avoid constipation. - Recommended stool softeners like Miralax or Colace. - Instructed follow-up in two weeks if no improvement for potential gastroenterology referral.  Hyperlipidemia Elevated LDL cholesterol level. - Recheck cholesterol levels in six months.  Vitamin D  deficiency Currently on prescription  vitamin D  supplementation. - Continue current vitamin D  supplementation.   Return in about 2 weeks (around 04/16/2024) for hemorrhoid .    Carrol Aurora, NP    [1]  Allergies Allergen Reactions   Vancomycin  Anaphylaxis

## 2024-04-02 NOTE — Patient Instructions (Signed)
 Please start Colace 100 mg two capsules at bedtime (these can be found over the counter).   Increase water intake and fiber intake.   Start Analapram cream.   F/u in 2 weeks; may cancel if better or has an appt with GI.   It was a pleasure to see you today!

## 2024-04-03 ENCOUNTER — Encounter: Payer: Self-pay | Admitting: General Practice

## 2024-04-03 NOTE — Telephone Encounter (Signed)
 Please start PA on hydrocortisone -pramoxine (ANALPRAM-HC) 2.5-1 % rectal cream.

## 2024-04-04 ENCOUNTER — Other Ambulatory Visit (HOSPITAL_COMMUNITY): Payer: Self-pay

## 2024-04-04 ENCOUNTER — Telehealth: Payer: Self-pay

## 2024-04-04 NOTE — Telephone Encounter (Signed)
 Pharmacy Patient Advocate Encounter  Received notification from [facility] that Prior Authorization for  Hydrocortisone  Ace-Pramoxine 2.5-1% cream  has been APPROVED from 03/05/2024 to 04/04/2025. Ran test claim, Copay is $0. This test claim was processed through [facility]- copay amounts may vary at other pharmacies due to pharmacy/plan contracts, or as the patient moves through the different stages of their insurance plan.   PA #/Case ID/Reference #: [id number]

## 2024-04-04 NOTE — Telephone Encounter (Signed)
 Pharmacy Patient Advocate Encounter   Received notification from Physician's Office that prior authorization for Hydrocortisone  Ace-Pramoxine 2.5-1% cream  is required/requested.   Insurance verification completed.   The patient is insured through [employer].   Per test claim: PA required; PA submitted to above mentioned insurance via Latent Key/confirmation #/EOC [facility] Status is pending

## 2024-05-03 ENCOUNTER — Encounter: Payer: Self-pay | Admitting: General Practice

## 2024-05-03 DIAGNOSIS — K649 Unspecified hemorrhoids: Secondary | ICD-10-CM

## 2024-05-24 ENCOUNTER — Other Ambulatory Visit: Payer: Self-pay

## 2024-05-24 ENCOUNTER — Other Ambulatory Visit: Payer: Self-pay | Admitting: General Practice

## 2024-05-24 DIAGNOSIS — E559 Vitamin D deficiency, unspecified: Secondary | ICD-10-CM

## 2024-05-31 ENCOUNTER — Ambulatory Visit: Admitting: Gastroenterology

## 2025-03-08 ENCOUNTER — Encounter: Admitting: General Practice
